# Patient Record
Sex: Female | Born: 1965 | Race: White | Hispanic: No | Marital: Married | Smoking: Never smoker
Health system: Southern US, Community
[De-identification: ages and names within clinical notes are randomized; demographics above are authoritative.]

## PROBLEM LIST (undated history)

## (undated) DIAGNOSIS — N200 Calculus of kidney: Secondary | ICD-10-CM

## (undated) DIAGNOSIS — T7840XA Allergy, unspecified, initial encounter: Secondary | ICD-10-CM

## (undated) DIAGNOSIS — I10 Essential (primary) hypertension: Secondary | ICD-10-CM

## (undated) DIAGNOSIS — E785 Hyperlipidemia, unspecified: Secondary | ICD-10-CM

## (undated) HISTORY — DX: Allergy, unspecified, initial encounter: T78.40XA

## (undated) HISTORY — DX: Calculus of kidney: N20.0

## (undated) HISTORY — DX: Essential (primary) hypertension: I10

---

## 1997-10-21 ENCOUNTER — Other Ambulatory Visit: Admission: RE | Admit: 1997-10-21 | Discharge: 1997-10-21 | Payer: Self-pay | Admitting: Obstetrics and Gynecology

## 1998-10-25 ENCOUNTER — Other Ambulatory Visit: Admission: RE | Admit: 1998-10-25 | Discharge: 1998-10-25 | Payer: Self-pay | Admitting: Obstetrics and Gynecology

## 1999-11-16 ENCOUNTER — Other Ambulatory Visit: Admission: RE | Admit: 1999-11-16 | Discharge: 1999-11-16 | Payer: Self-pay | Admitting: Obstetrics and Gynecology

## 2001-01-05 ENCOUNTER — Other Ambulatory Visit: Admission: RE | Admit: 2001-01-05 | Discharge: 2001-01-05 | Payer: Self-pay | Admitting: Obstetrics and Gynecology

## 2001-04-09 ENCOUNTER — Other Ambulatory Visit: Admission: RE | Admit: 2001-04-09 | Discharge: 2001-04-09 | Payer: Self-pay | Admitting: Obstetrics and Gynecology

## 2002-02-01 ENCOUNTER — Other Ambulatory Visit: Admission: RE | Admit: 2002-02-01 | Discharge: 2002-02-01 | Payer: Self-pay | Admitting: Obstetrics and Gynecology

## 2005-06-11 ENCOUNTER — Emergency Department (HOSPITAL_COMMUNITY): Admission: EM | Admit: 2005-06-11 | Discharge: 2005-06-11 | Payer: Self-pay | Admitting: Family Medicine

## 2009-05-10 ENCOUNTER — Ambulatory Visit: Payer: Self-pay | Admitting: Otolaryngology

## 2013-09-26 DIAGNOSIS — N200 Calculus of kidney: Secondary | ICD-10-CM

## 2013-09-26 HISTORY — DX: Calculus of kidney: N20.0

## 2014-05-03 ENCOUNTER — Encounter: Payer: Self-pay | Admitting: Family Medicine

## 2014-05-18 ENCOUNTER — Ambulatory Visit (INDEPENDENT_AMBULATORY_CARE_PROVIDER_SITE_OTHER): Payer: BC Managed Care – PPO | Admitting: Physician Assistant

## 2014-05-18 ENCOUNTER — Encounter: Payer: Self-pay | Admitting: Physician Assistant

## 2014-05-18 VITALS — BP 136/78 | HR 80 | Temp 98.2°F | Resp 18 | Ht 64.0 in | Wt 134.0 lb

## 2014-05-18 DIAGNOSIS — Z0000 Encounter for general adult medical examination without abnormal findings: Secondary | ICD-10-CM

## 2014-05-18 DIAGNOSIS — Z23 Encounter for immunization: Secondary | ICD-10-CM

## 2014-05-18 LAB — TSH: TSH: 1.222 u[IU]/mL (ref 0.350–4.500)

## 2014-05-18 LAB — CBC WITH DIFFERENTIAL/PLATELET
Basophils Absolute: 0.1 10*3/uL (ref 0.0–0.1)
Basophils Relative: 1 % (ref 0–1)
Eosinophils Absolute: 0.1 10*3/uL (ref 0.0–0.7)
Eosinophils Relative: 1 % (ref 0–5)
HCT: 43.5 % (ref 36.0–46.0)
HEMOGLOBIN: 14.8 g/dL (ref 12.0–15.0)
LYMPHS ABS: 1.5 10*3/uL (ref 0.7–4.0)
LYMPHS PCT: 22 % (ref 12–46)
MCH: 31.7 pg (ref 26.0–34.0)
MCHC: 34 g/dL (ref 30.0–36.0)
MCV: 93.1 fL (ref 78.0–100.0)
MONO ABS: 0.5 10*3/uL (ref 0.1–1.0)
MONOS PCT: 7 % (ref 3–12)
NEUTROS ABS: 4.8 10*3/uL (ref 1.7–7.7)
NEUTROS PCT: 69 % (ref 43–77)
Platelets: 327 10*3/uL (ref 150–400)
RBC: 4.67 MIL/uL (ref 3.87–5.11)
RDW: 13.5 % (ref 11.5–15.5)
WBC: 6.9 10*3/uL (ref 4.0–10.5)

## 2014-05-18 LAB — COMPLETE METABOLIC PANEL WITH GFR
ALT: 21 U/L (ref 0–35)
AST: 19 U/L (ref 0–37)
Albumin: 4.1 g/dL (ref 3.5–5.2)
Alkaline Phosphatase: 45 U/L (ref 39–117)
BUN: 11 mg/dL (ref 6–23)
CALCIUM: 9.3 mg/dL (ref 8.4–10.5)
CHLORIDE: 104 meq/L (ref 96–112)
CO2: 26 meq/L (ref 19–32)
Creat: 0.71 mg/dL (ref 0.50–1.10)
GFR, Est African American: >89 mL/min
GLUCOSE: 86 mg/dL (ref 70–99)
POTASSIUM: 4.4 meq/L (ref 3.5–5.3)
SODIUM: 138 meq/L (ref 135–145)
TOTAL PROTEIN: 6.6 g/dL (ref 6.0–8.3)
Total Bilirubin: 0.4 mg/dL (ref 0.2–1.2)

## 2014-05-18 LAB — LIPID PANEL
CHOL/HDL RATIO: 4.2 ratio
CHOLESTEROL: 258 mg/dL — AB (ref 0–200)
HDL: 62 mg/dL (ref >39)
LDL Cholesterol: 169 mg/dL — ABNORMAL HIGH (ref 0–99)
Triglycerides: 134 mg/dL (ref <150)
VLDL: 27 mg/dL (ref 0–40)

## 2014-05-18 NOTE — Progress Notes (Signed)
Patient ID: MAYLENE CROCKER MRN: [id number], DOB: 1965/11/08, 48 y.o. Date of Encounter: 05/18/2014,   Chief Complaint: Physical (CPE)  HPI: 48 y.o. y/o female  here for CPE.   She is being seen as a new patient to our office to establish care here. She says that her daughter sees me and her son and husband see Dr. Dennard Schaumann here. She felt that she needed to establish a PCP.  She does have a gynecologist who she sees. Last appointment  there was February 2014. Says that they do mammograms there in their office.  The only other medical providers she has seen is a urologist. Says that she saw them for kidney stone. Today she did bring in a copy of lab work done at the urologist. This included CMET., phosphorus level, and uric acid. All of these levels were normal. Patient states that "they told her to just drink more water."  Patient states that one thing that prompted her to initially schedule to get established here was that back around August she was feeling increased stress at work. Says that they did make some changes to her work. 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 Says that she just felt like in the past she would've been able to handle this a lot better than she was able to handle this this year. However, she says she also realizes she really had not had any change for 20 years until now.  She does not know whether it has to do with her age and Peri- Menopause. Says that when she first called to make this appointment back around August she was feeling a lot more stressed than she is now. Says that now that they have been back in school for a couple months she is actually feeling okay with things and really does not think that she needs any treatment for this.  No other complaints or concerns today.    Review of Systems: Consitutional: No fever, chills, fatigue, night sweats, lymphadenopathy. No  significant/unexplained weight changes. Eyes: No visual changes, eye redness, or discharge. ENT/Mouth: No ear pain, sore throat, nasal drainage, or sinus pain. Cardiovascular: No chest pressure,heaviness, tightness or squeezing, even with exertion. No increased shortness of breath or dyspnea on exertion.No palpitations, edema, orthopnea, PND. Respiratory: No cough, hemoptysis, SOB, or wheezing. Gastrointestinal: No anorexia, dysphagia, reflux, pain, nausea, vomiting, hematemesis, diarrhea, constipation, BRBPR, or melena. Breast: No mass, nodules, bulging, or retraction. No skin changes or inflammation. No nipple discharge. No lymphadenopathy. Genitourinary: No dysuria, hematuria, incontinence, vaginal discharge, pruritis, burning, abnormal bleeding, or pain. Musculoskeletal: No decreased ROM, No joint pain or swelling. No significant pain in neck, back, or extremities. Skin: No rash, pruritis, or concerning lesions. Neurological: No headache, dizziness, syncope, seizures, tremors, memory loss, coordination problems, or paresthesias. Psychological: No hallucinations, SI/HI. See HPI.o/w negative. Endocrine: No polydipsia, polyphagia, polyuria, or known diabetes.No increased fatigue. No palpitations/rapid heart rate. No significant/unexplained weight change. All other systems were reviewed and are otherwise negative.  Past Medical History  Diagnosis Date  . Allergy     seasonal  . Hypertension     pt states occasionally  . Kidney stones 09/2013     Past Surgical History  Procedure Laterality Date  . Cesarean section      Home Meds:  Outpatient Prescriptions Prior to Visit  Medication Sig Dispense Refill  . drospirenone-ethinyl estradiol (YAZ,GIANVI,LORYNA) 3-0.02 MG tablet Take  1 tablet by mouth daily.       No facility-administered medications prior to visit.    Allergies: No Known Allergies  History   Social History  . Marital Status: Married    Spouse Name: N/A    Number  of Children: N/A  . Years of Education: N/A   Occupational History  . Not on file.   Social History Main Topics  . Smoking status: Never Smoker   . Smokeless tobacco: Never Used  . Alcohol Use: No  . Drug Use: No  . Sexual Activity: Yes    Birth Control/ Protection: Pill   Other Topics Concern  . Not on file   Social History Narrative   Entered 2015:   Married. 50 son--17 y/o. 50 daughter--24 y/o.   Works as Location manager   does not exercise on a routine basis.  Family History  Problem Relation Age of Onset  . Hyperlipidemia Mother     Physical Exam: Blood pressure 136/78, pulse 80, temperature 98.2 F (36.8 C), temperature source Oral, resp. rate 18, height 5\' 4"  (1.626 m), weight 134 lb (60.782 kg), last menstrual period 05/09/2014., Body mass index is 22.99 kg/(m^2). General: Well developed, well nourished,White Female. Appears in no acute distress. HEENT: Normocephalic, atraumatic. Conjunctiva pink, sclera non-icteric. Pupils 2 mm constricting to 1 mm, round, regular, and equally reactive to light and accomodation. EOMI. Internal auditory canal clear. TMs with good cone of light and without pathology. Nasal mucosa pink. Nares are without discharge. No sinus tenderness. Oral mucosa pink.  Pharynx without exudate.   Neck: Supple. Trachea midline. No thyromegaly. Full ROM. No lymphadenopathy.No Carotid Bruits. Lungs: Clear to auscultation bilaterally without wheezes, rales, or rhonchi. Breathing is of normal effort and unlabored. Cardiovascular: RRR with S1 S2. No murmurs, rubs, or gallops. Distal pulses 2+ symmetrically. No carotid or abdominal bruits. Breast: Per Gyn.  Abdomen: Soft, non-tender, non-distended with normoactive bowel sounds. No hepatosplenomegaly or masses. No rebound/guarding. No CVA tenderness. No hernias.  Genitourinary:  Per Gyn. Musculoskeletal: Full range of motion and 5/5 strength throughout. Without swelling, atrophy, tenderness,  crepitus, or warmth. Extremities without clubbing, cyanosis, or edema.  Skin: Warm and moist without erythema, ecchymosis, wounds, or rash. Neuro: A+Ox3. CN II-XII grossly intact. Moves all extremities spontaneously. Full sensation throughout. Normal gait. DTR 2+ throughout upper and lower extremities. Finger to nose intact. Psych:  Responds to questions appropriately with a normal affect.   Assessment/Plan:  48 y.o. y/o female here for CPE 1. Visit for preventive health examination  A. Screening Labs: - CBC with Differential - COMPLETE METABOLIC PANEL WITH GFR - Lipid panel - TSH   B. Pap:   -------Per Gyn  C. Screening Mammogram: --------Per Gyn  D. DEXA/BMD:  -------per Gyn  E. Colorectal Cancer Screening: She reports that she had one colonoscopy but says it was greater than 10 years ago. She is agreeable for [place] to do referral to GI to do screening colonoscopy. - Ambulatory referral to Gastroenterology   F. Immunizations:  Influenza:  She is agreeable to receive this today. Tetanus:   She states that she was given a tetanus at the school system in the past 5 years. I told her to try to get a record of this and fax it or bring it to our office for [place] to have on record. Pneumococcal:  She has no indication to need pneumonia vaccine until age 20 Zostavax: Not indicated until age 48  Need for prophylactic vaccination and inoculation  against influenza - Flu Vaccine QUAD 36+ mos PF IM (Fluarix Quad PF)    Signed, [address] [place], [place], [facility] 05/18/2014 11:07 AM

## 2016-09-09 ENCOUNTER — Ambulatory Visit (INDEPENDENT_AMBULATORY_CARE_PROVIDER_SITE_OTHER): Payer: BC Managed Care – PPO | Admitting: Physician Assistant

## 2016-09-09 VITALS — BP 122/82 | HR 77 | Temp 97.4°F | Resp 14 | Wt 138.0 lb

## 2016-09-09 DIAGNOSIS — Z0000 Encounter for general adult medical examination without abnormal findings: Secondary | ICD-10-CM

## 2016-09-09 DIAGNOSIS — R1030 Lower abdominal pain, unspecified: Secondary | ICD-10-CM | POA: Diagnosis not present

## 2016-09-09 DIAGNOSIS — M545 Low back pain, unspecified: Secondary | ICD-10-CM

## 2016-09-09 DIAGNOSIS — Z23 Encounter for immunization: Secondary | ICD-10-CM

## 2016-09-09 LAB — COMPLETE METABOLIC PANEL WITH GFR
ALBUMIN: 4 g/dL (ref 3.6–5.1)
ALK PHOS: 42 U/L (ref 33–130)
ALT: 12 U/L (ref 6–29)
AST: 12 U/L (ref 10–35)
BUN: 15 mg/dL (ref 7–25)
CO2: 24 mmol/L (ref 20–31)
Calcium: 9.3 mg/dL (ref 8.6–10.4)
Chloride: 104 mmol/L (ref 98–110)
Creat: 0.78 mg/dL (ref 0.50–1.05)
GFR, EST NON AFRICAN AMERICAN: 88 mL/min (ref >=60)
GLUCOSE: 90 mg/dL (ref 70–99)
POTASSIUM: 4.2 mmol/L (ref 3.5–5.3)
SODIUM: 137 mmol/L (ref 135–146)
Total Bilirubin: 0.4 mg/dL (ref 0.2–1.2)
Total Protein: 6.3 g/dL (ref 6.1–8.1)

## 2016-09-09 LAB — URINALYSIS, ROUTINE W REFLEX MICROSCOPIC
Bilirubin Urine: NEGATIVE
GLUCOSE, UA: NEGATIVE
HGB URINE DIPSTICK: NEGATIVE
Ketones, ur: NEGATIVE
NITRITE: NEGATIVE
PH: 6.5 (ref 5.0–8.0)
Protein, ur: NEGATIVE
SPECIFIC GRAVITY, URINE: 1.01 (ref 1.001–1.035)

## 2016-09-09 LAB — URINALYSIS, MICROSCOPIC ONLY
CASTS: NONE SEEN [LPF]
CRYSTALS: NONE SEEN [HPF]
Yeast: NONE SEEN [HPF]

## 2016-09-09 LAB — CBC WITH DIFFERENTIAL/PLATELET
BASOS ABS: 55 {cells}/uL (ref 0–200)
Basophils Relative: 1 %
EOS PCT: 1 %
Eosinophils Absolute: 55 cells/uL (ref 15–500)
HCT: 42.3 % (ref 35.0–45.0)
Hemoglobin: 14 g/dL (ref 12.0–15.0)
Lymphocytes Relative: 23 %
Lymphs Abs: 1265 cells/uL (ref 850–3900)
MCH: 31.2 pg (ref 27.0–33.0)
MCHC: 33.1 g/dL (ref 32.0–36.0)
MCV: 94.2 fL (ref 80.0–100.0)
MONOS PCT: 6 %
MPV: 9 fL (ref 7.5–12.5)
Monocytes Absolute: 330 cells/uL (ref 200–950)
NEUTROS PCT: 69 %
Neutro Abs: 3795 cells/uL (ref 1500–7800)
PLATELETS: 272 10*3/uL (ref 140–400)
RBC: 4.49 MIL/uL (ref 3.80–5.10)
RDW: 13.7 % (ref 11.0–15.0)
WBC: 5.5 10*3/uL (ref 3.8–10.8)

## 2016-09-09 LAB — LIPID PANEL
CHOL/HDL RATIO: 4.7 ratio (ref <5.0)
Cholesterol: 261 mg/dL — ABNORMAL HIGH (ref <200)
HDL: 55 mg/dL (ref >50)
LDL Cholesterol: 158 mg/dL — ABNORMAL HIGH (ref <100)
TRIGLYCERIDES: 240 mg/dL — AB (ref <150)
VLDL: 48 mg/dL — ABNORMAL HIGH (ref <30)

## 2016-09-09 LAB — TSH: TSH: 1.65 m[IU]/L

## 2016-09-09 MED ORDER — DROSPIRENONE-ETHINYL ESTRADIOL 3-0.02 MG PO TABS: 1.0000 | ORAL_TABLET | Freq: Every day | ORAL | 11 refills | Status: DC

## 2016-09-09 NOTE — Progress Notes (Signed)
Patient ID: Tabitha Warren MRN: [id number], DOB: [birth year], 51 y.o. Date of Encounter: 09/09/2016,   Chief Complaint: Physical (CPE)  HPI: 51 y.o. y/o female      05/18/2014: Here for CPE.   She is being seen as a new patient to our office to establish care here. She says that her daughter sees me and her son and husband see Dr. Dennard Warren here. She felt that she needed to establish a PCP.  She does have a gynecologist who she sees. Last appointment  there was February 2014. Says that they do mammograms there in their office.  The only other medical providers she has seen is a urologist. Says that she saw them for kidney stone. Today she did bring in a copy of lab work done at the urologist. This included CMET., phosphorus level, and uric acid. All of these levels were normal. Patient states that "they told her to just drink more water."  Patient states that one thing that prompted her to initially schedule to get established here was that back around August she was feeling increased stress at work. Says that they did make some changes to her work. 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 Says that she just felt like in the past she would've been able to handle this a lot better than she was able to handle this this year. However, she says she also realizes she really had not had any change for 20 years until now.  She does not know whether it has to do with her age and Peri- Menopause. Says that when she first called to make this appointment back around August she was feeling a lot more stressed than she is now. Says that now that they have been back in school for a couple months she is actually feeling okay with things and really does not think that she needs any treatment for this.  No other complaints or concerns today.    09/09/2016: She reports that after her last physical she checked her records and  her prior colonoscopy was performed March 2007. This was performed up in [place]. Says that back then she was living and working up in that area but no longer is-- so would like to start getting these done in [place]. Also reports that she had been seeing GYN in [place] and would like for me to do her pelvic exam and Pap smear here. Was having her mammogram performed by the GYN in [place] but would like to start having this performed in [place] now.  Reports that for the past 1-2 weeks she has been feeling a little bit of burning and pressure in her low mid abdomen. Also some achiness in her right low back. Saturday (09/07/2016) the pain in her right low back would increase if she stepped a certain way-- but since then it is just been more of an achiness. "Thought it was a pulled muscle but not sure". No dysuria, no urinary urgency or frequency.  Reports that she has retired. Now is keeping her 53-month-old Tabitha Warren.  At end of visit asks if I will send refill on her BCP. Says it was started by her Gyn b/c of heavy menses. Discussed / Recommended stopping this given her age and risk of hormones. She requests that I refill for now and she "will think about it" Discussed risks associated with hormone therapy.  Review of  Systems: Consitutional: No fever, chills, fatigue, night sweats, lymphadenopathy. No significant/unexplained weight changes. Eyes: No visual changes, eye redness, or discharge. ENT/Mouth: No ear pain, sore throat, nasal drainage, or sinus pain. Cardiovascular: No chest pressure,heaviness, tightness or squeezing, even with exertion. No increased shortness of breath or dyspnea on exertion.No palpitations, edema, orthopnea, PND. Respiratory: No cough, hemoptysis, SOB, or wheezing. Gastrointestinal: No anorexia, dysphagia, reflux, nausea, vomiting, hematemesis, diarrhea, constipation, BRBPR, or melena. Breast: No mass, nodules, bulging, or retraction. No skin changes or  inflammation. No nipple discharge. No lymphadenopathy. Genitourinary: No dysuria, hematuria, incontinence, vaginal discharge, pruritis, burning, abnormal bleeding, or pain. Musculoskeletal: No decreased ROM, No joint pain or swelling. No significant pain in neck, back, or extremities. Skin: No rash, pruritis, or concerning lesions. Neurological: No headache, dizziness, syncope, seizures, tremors, memory loss, coordination problems, or paresthesias. Psychological: No hallucinations, SI/HI. See HPI.o/w negative. Endocrine: No polydipsia, polyphagia, polyuria, or known diabetes.No increased fatigue. No palpitations/rapid heart rate. No significant/unexplained weight change. All other systems were reviewed and are otherwise negative.  Past Medical History:  Diagnosis Date  . Allergy    seasonal  . Hypertension    pt states occasionally  . Kidney stones 09/2013     Past Surgical History:  Procedure Laterality Date  . CESAREAN SECTION      Home Meds:  Outpatient Medications Prior to Visit  Medication Sig Dispense Refill  . drospirenone-ethinyl estradiol (YAZ,GIANVI,LORYNA) 3-0.02 MG tablet Take 1 tablet by mouth daily.     No facility-administered medications prior to visit.     Allergies: No Known Allergies  Social History   Social History  . Marital status: Married    Spouse name: N/A  . Number of children: N/A  . Years of education: N/A   Occupational History  . Not on file.   Social History Main Topics  . Smoking status: Never Smoker  . Smokeless tobacco: Never Used  . Alcohol use No  . Drug use: No  . Sexual activity: Yes    Birth control/ protection: Pill   Other Topics Concern  . Not on file   Social History Narrative   Entered 2015:   Married. 53 son--17 y/o. 47 daughter--24 y/o.   Works as Location manager   does not exercise on a routine basis. 08/2016---Retired. Now keeps her 15 month old Tabitha Warren  Family History  Problem Relation  Age of Onset  . Hyperlipidemia Mother     Physical Exam: Blood pressure 122/82, pulse 77, temperature 97.4 F (36.3 C), temperature source Oral, resp. rate 14, weight 138 lb (62.6 kg), SpO2 98 %., Body mass index is 23.69 kg/m. General: Well developed, well nourished,White Female. Appears in no acute distress. HEENT: Normocephalic, atraumatic. Conjunctiva pink, sclera non-icteric. Pupils 2 mm constricting to 1 mm, round, regular, and equally reactive to light and accomodation. EOMI. Internal auditory canal clear. TMs with good cone of light and without pathology. Nasal mucosa pink. Nares are without discharge. No sinus tenderness. Oral mucosa pink.  Pharynx without exudate.   Neck: Supple. Trachea midline. No thyromegaly. Full ROM. No lymphadenopathy.No Carotid Bruits. Lungs: Clear to auscultation bilaterally without wheezes, rales, or rhonchi. Breathing is of normal effort and unlabored. Cardiovascular: RRR with S1 S2. No murmurs, rubs, or gallops. Distal pulses 2+ symmetrically. No carotid or abdominal bruits. Breast: Inspection is normal with no skin changes. Palpation is normal with no mass. No nipple discharge. Abdomen: Soft, non-tender, non-distended with normoactive bowel sounds. No hepatosplenomegaly or masses. No rebound/guarding. No CVA  tenderness. No hernias.  Genitourinary:  External genitalia normal. Vaginal mucosa normal. Cervix normal. Bimanual exam is normal with no adnexal mass or tenderness. Musculoskeletal: Full range of motion and 5/5 strength throughout.  Skin: Warm and moist without erythema, ecchymosis, wounds, or rash. Neuro: A+Ox3. CN II-XII grossly intact. Moves all extremities spontaneously. Full sensation throughout. Normal gait. DTR 2+ throughout upper and lower extremities.  Psych:  Responds to questions appropriately with a normal affect.   Assessment/Plan:  51 y.o. y/o female here for CPE 1. Visit for preventive health examination  A. Screening Labs: - CBC  with Differential - COMPLETE METABOLIC PANEL WITH GFR - Lipid panel - TSH - Vitamin D  B. Pap:   - PAP, Thin Prep w/HPV rflx HPV Type 16/18   C. Screening Mammogram: - MM Digital Screening; Future  D. DEXA/BMD:  Only age 55. Can wait to start doing this.  E. Colorectal Cancer Screening: See HPI from 09/09/2016 OV notefor details - Ambulatory referral to Gastroenterology  F. Immunizations:  Influenza:  She is agreeable to receive this today. Tetanus:   She states that she was given a tetanus at the school system in the past 5 years. I told her to try to get a record of this and fax it or bring it to our office for [place] to have on record. Pneumococcal:  She has no indication to need pneumonia vaccine until age 36 Zostavax: Not indicated until age 42  Need for prophylactic vaccination and inoculation against influenza - Flu Vaccine QUAD 36+ mos PF IM (Fluarix Quad PF)   2. Lower abdominal pain - Urinalysis, Routine w reflex microscopic - Urine culture  3. Acute right-sided low back pain without sciatica - Urinalysis, Routine w reflex microscopic - Urine culture  At end of visit asks if I will send refill on her BCP. Says it was started by her Gyn b/c of heavy menses. Discussed / Recommended stopping this given her age and risk of hormones. She requests that I refill for now and she "will think about it" Discussed risks associated with hormone therapy.   Follow Up office visit one year or sooner if needed.    Marin Olp Vincent, [place], [facility] 09/09/2016 8:56 AM

## 2016-09-09 NOTE — Addendum Note (Signed)
Addended by: Vonna Kotyk A on: 09/09/2016 09:41 AM   Modules accepted: Orders

## 2016-09-10 LAB — VITAMIN D 25 HYDROXY (VIT D DEFICIENCY, FRACTURES): Vit D, 25-Hydroxy: 18 ng/mL — ABNORMAL LOW (ref 30–100)

## 2016-09-10 LAB — URINE CULTURE: Organism ID, Bacteria: NO GROWTH

## 2016-09-11 ENCOUNTER — Other Ambulatory Visit: Payer: Self-pay

## 2016-09-11 DIAGNOSIS — E559 Vitamin D deficiency, unspecified: Secondary | ICD-10-CM

## 2016-09-11 MED ORDER — CHOLECALCIFEROL 100 MCG (4000 UT) PO CAPS: 1.0000 | ORAL_CAPSULE | Freq: Every day | ORAL | 0 refills | Status: AC

## 2016-09-12 LAB — PAP, THIN PREP W/HPV RFLX HPV TYPE 16/18: HPV DNA High Risk: NOT DETECTED

## 2016-10-17 ENCOUNTER — Ambulatory Visit
Admission: RE | Admit: 2016-10-17 | Discharge: 2016-10-17 | Disposition: A | Discharge status: Home / Self Care | Payer: BC Managed Care – PPO | Source: Ambulatory Visit | Attending: Physician Assistant | Admitting: Physician Assistant

## 2016-10-17 DIAGNOSIS — Z0000 Encounter for general adult medical examination without abnormal findings: Secondary | ICD-10-CM

## 2016-10-17 DIAGNOSIS — Z1231 Encounter for screening mammogram for malignant neoplasm of breast: Secondary | ICD-10-CM | POA: Diagnosis not present

## 2016-10-22 ENCOUNTER — Inpatient Hospital Stay
Admission: RE | Admit: 2016-10-22 | Discharge: 2016-10-22 | Disposition: A | Discharge status: Home / Self Care | Payer: Self-pay | Source: Ambulatory Visit | Attending: *Deleted | Admitting: *Deleted

## 2016-10-22 ENCOUNTER — Other Ambulatory Visit: Payer: Self-pay | Admitting: *Deleted

## 2016-10-22 DIAGNOSIS — Z9289 Personal history of other medical treatment: Secondary | ICD-10-CM

## 2016-10-22 LAB — HM MAMMOGRAPHY

## 2016-10-30 ENCOUNTER — Telehealth: Payer: Self-pay | Admitting: Gastroenterology

## 2016-10-30 ENCOUNTER — Other Ambulatory Visit: Payer: Self-pay

## 2016-10-30 ENCOUNTER — Telehealth: Payer: Self-pay

## 2016-10-30 DIAGNOSIS — Z1212 Encounter for screening for malignant neoplasm of rectum: Principal | ICD-10-CM

## 2016-10-30 DIAGNOSIS — Z1211 Encounter for screening for malignant neoplasm of colon: Secondary | ICD-10-CM

## 2016-10-30 NOTE — Telephone Encounter (Signed)
Patient is returning a call regarding a colonoscopy

## 2016-10-30 NOTE — Telephone Encounter (Signed)
Gastroenterology Pre-Procedure Review  Request Date: 11/19/16 Requesting Physician: Dr. Vicente Males  PATIENT REVIEW QUESTIONS: The patient responded to the following health history questions as indicated:    1. Are you having any GI issues? no 2. Do you have a personal history of Polyps? no 3. Do you have a family history of Colon Cancer or Polyps? no 4. Diabetes Mellitus? no 5. Joint replacements in the past 12 months?no 6. Major health problems in the past 3 months?no 7. Any artificial heart valves, MVP, or defibrillator?no    MEDICATIONS & ALLERGIES:    Patient reports the following regarding taking any anticoagulation/antiplatelet therapy:   Plavix, Coumadin, Eliquis, Xarelto, Lovenox, Pradaxa, Brilinta, or Effient? no Aspirin? no  Patient confirms/reports the following medications:  Current Outpatient Prescriptions  Medication Sig Dispense Refill  . Cholecalciferol 4000 units CAPS Take 1 capsule (4,000 Units total) by mouth daily. 30 capsule 0  . drospirenone-ethinyl estradiol (YAZ,GIANVI,LORYNA) 3-0.02 MG tablet Take 1 tablet by mouth daily. 1 Package 11   No current facility-administered medications for this visit.     Patient confirms/reports the following allergies:  No Known Allergies  No orders of the defined types were placed in this encounter.   AUTHORIZATION INFORMATION Primary Insurance: 1D#: Group #:  Secondary Insurance: 1D#: Group #:  SCHEDULE INFORMATION: Date: 11/19/16 Time: Location: [place]

## 2016-10-30 NOTE — Telephone Encounter (Signed)
10/30/16 Spoke with Jodi Geralds w/ [employer] and prior auth is NOT required for Screening Colonoscopy [phone] / Z12.11

## 2016-11-14 ENCOUNTER — Telehealth: Payer: Self-pay | Admitting: Gastroenterology

## 2016-11-14 NOTE — Telephone Encounter (Signed)
Patient needs to reschedule her colonoscopy

## 2016-11-28 ENCOUNTER — Encounter: Payer: Self-pay | Admitting: *Deleted

## 2016-11-29 ENCOUNTER — Encounter
Admission: RE | Disposition: A | Discharge status: Home / Self Care | Payer: Self-pay | Source: Ambulatory Visit | Attending: Gastroenterology

## 2016-11-29 ENCOUNTER — Ambulatory Visit
Admission: RE | Admit: 2016-11-29 | Discharge: 2016-11-29 | Disposition: A | Discharge status: Home / Self Care | Payer: BC Managed Care – PPO | Source: Ambulatory Visit | Attending: Gastroenterology | Admitting: Gastroenterology

## 2016-11-29 ENCOUNTER — Ambulatory Visit: Payer: BC Managed Care – PPO | Admitting: Registered Nurse

## 2016-11-29 ENCOUNTER — Encounter: Payer: Self-pay | Admitting: Anesthesiology

## 2016-11-29 DIAGNOSIS — Z1212 Encounter for screening for malignant neoplasm of rectum: Secondary | ICD-10-CM | POA: Diagnosis not present

## 2016-11-29 DIAGNOSIS — Z1211 Encounter for screening for malignant neoplasm of colon: Secondary | ICD-10-CM | POA: Insufficient documentation

## 2016-11-29 DIAGNOSIS — I10 Essential (primary) hypertension: Secondary | ICD-10-CM | POA: Diagnosis not present

## 2016-11-29 DIAGNOSIS — Z793 Long term (current) use of hormonal contraceptives: Secondary | ICD-10-CM | POA: Diagnosis not present

## 2016-11-29 DIAGNOSIS — D120 Benign neoplasm of cecum: Secondary | ICD-10-CM | POA: Diagnosis not present

## 2016-11-29 HISTORY — PX: COLONOSCOPY WITH PROPOFOL: SHX5780

## 2016-11-29 LAB — POCT PREGNANCY, URINE: Preg Test, Ur: NEGATIVE

## 2016-11-29 SURGERY — COLONOSCOPY WITH PROPOFOL
Anesthesia: General

## 2016-11-29 MED ORDER — PROPOFOL 10 MG/ML IV BOLUS
INTRAVENOUS | Status: AC
  Filled 2016-11-29: qty 40

## 2016-11-29 MED ORDER — METHYLENE BLUE 0.5 % INJ SOLN
INTRAVENOUS | Status: DC | PRN
  Administered 2016-11-29: 7 mL via SUBMUCOSAL

## 2016-11-29 MED ORDER — SODIUM CHLORIDE 0.9 % IV SOLN
INTRAVENOUS | Status: DC
  Administered 2016-11-29: 1000 mL via INTRAVENOUS

## 2016-11-29 MED ORDER — FENTANYL CITRATE (PF) 100 MCG/2ML IJ SOLN
INTRAMUSCULAR | Status: AC
  Filled 2016-11-29: qty 2

## 2016-11-29 MED ORDER — PROPOFOL 500 MG/50ML IV EMUL
INTRAVENOUS | Status: DC | PRN
  Administered 2016-11-29: 150 ug/kg/min via INTRAVENOUS

## 2016-11-29 MED ORDER — MIDAZOLAM HCL 2 MG/2ML IJ SOLN
INTRAMUSCULAR | Status: AC
  Filled 2016-11-29: qty 2

## 2016-11-29 MED ORDER — SODIUM CHLORIDE 0.9 % IJ SOLN
INTRAMUSCULAR | Status: DC | PRN
  Administered 2016-11-29: 2 mL

## 2016-11-29 MED ORDER — METHYLENE BLUE 0.5 % INJ SOLN
INTRAVENOUS | Status: AC
  Filled 2016-11-29: qty 10

## 2016-11-29 MED ORDER — LIDOCAINE HCL (CARDIAC) 20 MG/ML IV SOLN
INTRAVENOUS | Status: DC | PRN
  Administered 2016-11-29: 40 mg via INTRAVENOUS

## 2016-11-29 MED ORDER — PROPOFOL 10 MG/ML IV BOLUS
INTRAVENOUS | Status: DC | PRN
  Administered 2016-11-29: 70 mg via INTRAVENOUS
  Administered 2016-11-29 (×2): 10 mg via INTRAVENOUS

## 2016-11-29 MED ORDER — MIDAZOLAM HCL 2 MG/2ML IJ SOLN
INTRAMUSCULAR | Status: DC | PRN
  Administered 2016-11-29: 2 mg via INTRAVENOUS

## 2016-11-29 MED ORDER — FENTANYL CITRATE (PF) 100 MCG/2ML IJ SOLN
INTRAMUSCULAR | Status: DC | PRN
  Administered 2016-11-29 (×2): 25 ug via INTRAVENOUS
  Administered 2016-11-29: 50 ug via INTRAVENOUS

## 2016-11-29 NOTE — Anesthesia Post-op Follow-up Note (Cosign Needed)
Anesthesia QCDR form completed.        

## 2016-11-29 NOTE — Anesthesia Preprocedure Evaluation (Signed)
Anesthesia Evaluation  Patient identified by MRN, date of birth, ID band Patient awake    Reviewed: Allergy & Precautions, NPO status , Patient's Chart, lab work & pertinent test results  Airway Mallampati: II       Dental no notable dental hx.    Pulmonary neg pulmonary ROS,    Pulmonary exam normal        Cardiovascular hypertension, Normal cardiovascular exam     Neuro/Psych negative neurological ROS  negative psych ROS   GI/Hepatic negative GI ROS, Neg liver ROS,   Endo/Other  negative endocrine ROS  Renal/GU stones  negative genitourinary   Musculoskeletal negative musculoskeletal ROS (+)   Abdominal Normal abdominal exam  (+)   Peds negative pediatric ROS (+)  Hematology negative hematology ROS (+)   Anesthesia Other Findings   Reproductive/Obstetrics negative OB ROS                             Anesthesia Physical Anesthesia Plan  ASA: II  Anesthesia Plan: General   Post-op Pain Management:    Induction: Intravenous  Airway Management Planned: Nasal Cannula  Additional Equipment:   Intra-op Plan:   Post-operative Plan:   Informed Consent: I have reviewed the patients History and Physical, chart, labs and discussed the procedure including the risks, benefits and alternatives for the proposed anesthesia with the patient or authorized representative who has indicated his/her understanding and acceptance.   Dental advisory given  Plan Discussed with: CRNA and Surgeon  Anesthesia Plan Comments:         Anesthesia Quick Evaluation

## 2016-11-29 NOTE — H&P (Signed)
  Jonathon Bellows MD [address], [place], [place] [address] Phone: [phone] Fax : [phone]  Primary Care Physician:  Karis Juba, PA-C Primary Gastroenterologist:  Dr. Jonathon Bellows   Pre-Procedure History & Physical: HPI:  Tabitha Warren is a 51 y.o. female is here for an colonoscopy.   Past Medical History:  Diagnosis Date  . Allergy    seasonal  . Hypertension    pt states occasionally  . Kidney stones 09/2013    Past Surgical History:  Procedure Laterality Date  . CESAREAN SECTION      Prior to Admission medications   Medication Sig Start Date End Date Taking? Authorizing Provider  Cholecalciferol 4000 units CAPS Take 1 capsule (4,000 Units total) by mouth daily. 09/11/16  Yes Orlena Sheldon, PA-C  drospirenone-ethinyl estradiol (YAZ,GIANVI,LORYNA) 3-0.02 MG tablet Take 1 tablet by mouth daily. 09/09/16  Yes Orlena Sheldon, PA-C    Allergies as of 10/30/2016  . (No Known Allergies)    Family History  Problem Relation Age of Onset  . Hyperlipidemia Mother     Social History   Social History  . Marital status: Married    Spouse name: N/A  . Number of children: N/A  . Years of education: N/A   Occupational History  . Not on file.   Social History Main Topics  . Smoking status: Never Smoker  . Smokeless tobacco: Never Used  . Alcohol use No  . Drug use: No  . Sexual activity: Yes    Birth control/ protection: Pill   Other Topics Concern  . Not on file   Social History Narrative   Entered 2015:   Married. 50 son--17 y/o. 69 daughter--24 y/o.   Works as Location manager    Review of Systems: See HPI, otherwise negative ROS  Physical Exam: BP (!) 147/82   Pulse 89   Temp 97 F (36.1 C) (Tympanic)   Resp 16   Ht 5\' 3"  (1.6 m)   Wt 140 lb (63.5 kg)   SpO2 100%   BMI 24.80 kg/m  General:   Alert,  pleasant and cooperative in NAD Head:  Normocephalic and atraumatic. Neck:  Supple; no masses or  thyromegaly. Lungs:  Clear throughout to auscultation.    Heart:  Regular rate and rhythm. Abdomen:  Soft, nontender and nondistended. Normal bowel sounds, without guarding, and without rebound.   Neurologic:  Alert and  oriented x4;  grossly normal neurologically.  Impression/Plan: AYISHA POL is here for an colonoscopy to be performed for Screening colonoscopy average risk    Risks, benefits, limitations, and alternatives regarding  colonoscopy have been reviewed with the patient.  Questions have been answered.  All parties agreeable.   Jonathon Bellows, MD  11/29/2016, 9:39 AM

## 2016-11-29 NOTE — Anesthesia Procedure Notes (Signed)
Date/Time: 11/29/2016 9:44 AM Performed by: Doreen Salvage Pre-anesthesia Checklist: Patient identified, Emergency Drugs available, Suction available and Patient being monitored Patient Re-evaluated:Patient Re-evaluated prior to inductionOxygen Delivery Method: Nasal cannula Intubation Type: IV induction Dental Injury: Teeth and Oropharynx as per pre-operative assessment  Comments: Nasal cannula with etCO2 monitoring

## 2016-11-29 NOTE — Anesthesia Postprocedure Evaluation (Signed)
Anesthesia Post Note  Patient: Tabitha Warren  Procedure(s) Performed: Procedure(s) (LRB): COLONOSCOPY WITH PROPOFOL (N/A)  Patient location during evaluation: PACU Anesthesia Type: General Level of consciousness: awake and alert and oriented Pain management: pain level controlled Vital Signs Assessment: post-procedure vital signs reviewed and stable Respiratory status: spontaneous breathing Cardiovascular status: blood pressure returned to baseline Anesthetic complications: no     Last Vitals:  Vitals:   11/29/16 1035 11/29/16 1045  BP: 131/90 (!) 126/92  Pulse: 70 69  Resp: 19 17  Temp:      Last Pain:  Vitals:   11/29/16 1016  TempSrc: Tympanic                 Eladio Dentremont

## 2016-11-29 NOTE — Op Note (Signed)
[facility] Gastroenterology Patient Name: Tabitha Warren Procedure Date: 11/29/2016 9:39 AM MRN: [id number] Account #: [id number] Date of Birth: 01-22-1966 Admit Type: Outpatient Age: 51 Room: [place] ENDO ROOM 4 Gender: Female Note Status: Finalized Procedure:            Colonoscopy Indications:          Screening for colorectal malignant neoplasm Providers:            Jonathon Bellows MD, MD Referring MD:         Lonie Peak. Doren Custard (Referring MD) Medicines:            Monitored Anesthesia Care Complications:        No immediate complications. Procedure:            Pre-Anesthesia Assessment:                       - Prior to the procedure, a History and Physical was                        performed, and patient medications, allergies and                        sensitivities were reviewed. The patient's tolerance of                        previous anesthesia was reviewed.                       - The risks and benefits of the procedure and the                        sedation options and risks were discussed with the                        patient. All questions were answered and informed                        consent was obtained.                       - ASA Grade Assessment: II - A patient with mild                        systemic disease.                       After obtaining informed consent, the colonoscope was                        passed under direct vision. Throughout the procedure,                        the patient's blood pressure, pulse, and oxygen                        saturations were monitored continuously. The                        Colonoscope was introduced through the anus and  advanced to the the cecum, identified by the                        appendiceal orifice, IC valve and transillumination.                        The colonoscopy was performed with ease. The patient                        tolerated the procedure well. The quality of  the bowel                        preparation was good. Findings:      The perianal and digital rectal examinations were normal.      A 20 mm polyp was found in the cecum. The polyp was semi-sessile.       Preparations were made for mucosal resection. Saline with methylene blue       was injected to raise the lesion. Snare mucosal resection was performed.       Resection and retrieval were complete. To prevent bleeding after the       polypectomy, two hemostatic clips were successfully placed. There was no       bleeding during the maneuver.      The exam was otherwise without abnormality on direct and retroflexion       views. Impression:           - One 20 mm polyp in the cecum, removed with mucosal                        resection. Resected and retrieved. Clips were placed.                       - The examination was otherwise normal on direct and                        retroflexion views.                       - Mucosal resection was performed. Resection and                        retrieval were complete. Recommendation:       - Discharge patient to home (with escort).                       - Resume previous diet.                       - Continue present medications.                       - Await pathology results.                       - Repeat colonoscopy for surveillance based on                        pathology results. Procedure Code(s):    --- Professional ---                       [phone], Colonoscopy, flexible; with endoscopic  mucosal                        resection Diagnosis Code(s):    --- Professional ---                       Z12.11, Encounter for screening for malignant neoplasm                        of colon                       D12.0, Benign neoplasm of cecum CPT copyright 2016 American Medical Association. All rights reserved. The codes documented in this report are preliminary and upon coder review may  be revised to meet current compliance requirements. Jonathon Bellows, MD Jonathon Bellows MD, MD 11/29/2016 10:13:01 AM This report has been signed electronically. Number of Addenda: 0 Note Initiated On: 11/29/2016 9:39 AM Scope Withdrawal Time: 0 hours 20 minutes 5 seconds  Total Procedure Duration: 0 hours 23 minutes 0 seconds       [facility]

## 2016-11-29 NOTE — Transfer of Care (Signed)
Immediate Anesthesia Transfer of Care Note  Patient: Tabitha Warren  Procedure(s) Performed: Procedure(s): COLONOSCOPY WITH PROPOFOL (N/A)  Patient Location: PACU and Endoscopy Unit  Anesthesia Type:General  Level of Consciousness: sedated  Airway & Oxygen Therapy: Patient Spontanous Breathing and Patient connected to nasal cannula oxygen  Post-op Assessment: Report given to RN and Post -op Vital signs reviewed and stable  Post vital signs: Reviewed and stable  Last Vitals:  Vitals:   11/29/16 0841 11/29/16 1016  BP: (!) 147/82 113/81  Pulse: 89 85  Resp: 16 20  Temp: 36.1 C 16.5 C    Complications: No apparent anesthesia complications

## 2016-12-02 ENCOUNTER — Encounter: Payer: Self-pay | Admitting: Gastroenterology

## 2016-12-02 LAB — SURGICAL PATHOLOGY

## 2017-08-31 ENCOUNTER — Other Ambulatory Visit: Payer: Self-pay | Admitting: Physician Assistant

## 2017-09-01 NOTE — Telephone Encounter (Signed)
Rx filled per protocol. Letter mailed for patient to call and schedule an appointment.

## 2017-09-15 ENCOUNTER — Encounter: Payer: BC Managed Care – PPO | Admitting: Physician Assistant

## 2017-09-18 ENCOUNTER — Ambulatory Visit (INDEPENDENT_AMBULATORY_CARE_PROVIDER_SITE_OTHER): Payer: BC Managed Care – PPO | Admitting: Physician Assistant

## 2017-09-18 ENCOUNTER — Encounter: Payer: Self-pay | Admitting: Physician Assistant

## 2017-09-18 ENCOUNTER — Other Ambulatory Visit: Payer: Self-pay

## 2017-09-18 ENCOUNTER — Encounter: Payer: BC Managed Care – PPO | Admitting: Physician Assistant

## 2017-09-18 VITALS — BP 132/88 | HR 89 | Temp 98.1°F | Resp 14 | Ht 65.5 in | Wt 141.6 lb

## 2017-09-18 DIAGNOSIS — E559 Vitamin D deficiency, unspecified: Secondary | ICD-10-CM

## 2017-09-18 DIAGNOSIS — Z23 Encounter for immunization: Secondary | ICD-10-CM | POA: Diagnosis not present

## 2017-09-18 DIAGNOSIS — Z0000 Encounter for general adult medical examination without abnormal findings: Secondary | ICD-10-CM | POA: Diagnosis not present

## 2017-09-18 NOTE — Progress Notes (Signed)
Patient ID: Tabitha Warren MRN: [id number], DOB: July 10, 1966, 52 y.o. Date of Encounter: 09/18/2017,   Chief Complaint: Physical (CPE)  HPI: 52 y.o. y/o female      05/18/2014: Here for CPE.   She is being seen as a new patient to our office to establish care here. She says that her daughter sees me and her son and husband see Dr. Dennard Schaumann here. She felt that she needed to establish a PCP.  She does have a gynecologist who she sees. Last appointment  there was February 2014. Says that they do mammograms there in their office.  The only other medical providers she has seen is a urologist. Says that she saw them for kidney stone. Today she did bring in a copy of lab work done at the urologist. This included CMET., phosphorus level, and uric acid. All of these levels were normal. Patient states that "they told her to just drink more water."  Patient states that one thing that prompted her to initially schedule to get established here was that back around August she was feeling increased stress at work. Says that they did make some changes to her work. 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 Says that she just felt like in the past she would've been able to handle this a lot better than she was able to handle this this year. However, she says she also realizes she really had not had any change for 20 years until now.  She does not know whether it has to do with her age and Peri- Menopause. Says that when she first called to make this appointment back around August she was feeling a lot more stressed than she is now. Says that now that they have been back in school for a couple months she is actually feeling okay with things and really does not think that she needs any treatment for this.  No other complaints or concerns today.    09/09/2016: She reports that after her last physical she checked her records and  her prior colonoscopy was performed March 2007. This was performed up in [place]. Says that back then she was living and working up in that area but no longer is-- so would like to start getting these done in [place]. Also reports that she had been seeing GYN in [place] and would like for me to do her pelvic exam and Pap smear here. Was having her mammogram performed by the GYN in [place] but would like to start having this performed in [place] now.  Reports that for the past 1-2 weeks she has been feeling a little bit of burning and pressure in her low mid abdomen. Also some achiness in her right low back. Saturday (09/07/2016) the pain in her right low back would increase if she stepped a certain way-- but since then it is just been more of an achiness. "Thought it was a pulled muscle but not sure". No dysuria, no urinary urgency or frequency.  Reports that she has retired. Now is keeping her 42-month-old [name].  At end of visit asks if I will send refill on her BCP. Says it was started by her Gyn b/c of heavy menses. Discussed / Recommended stopping this given her age and risk of hormones. She requests that I refill for now and she "will think about it" Discussed risks associated with hormone therapy.   09/18/2017:  She reports that she has been feeling good and has no medical updates over the past year.  Everything has seemed stable. She does keep her 2 grandchildren 5 days a week.  One of them is almost 54 years old and the other is almost 54 year old. Reviewed that at her labs last year her vitamin D was low and recommended she start over-the-counter vitamin D 4000 units daily.  She states that she is taking this as directed.  Reviewed that also recommended decrease saturated fats in diet.  Today her response is "I could probably do better on that.  Probably need to cut out some snacks and junk food ". Reviewed that she had mammogram March 2018 at [facility].  This shows  up in epic but then I cannot actually read the report. Same is true for her colonoscopy.  I can see that this was performed 11/29/16 but cannot actually find the report.  Says this was also at [place] it did show a polyp and was told to repeat 3 years.   Review of Systems: Consitutional: No fever, chills, fatigue, night sweats, lymphadenopathy. No significant/unexplained weight changes. Eyes: No visual changes, eye redness, or discharge. ENT/Mouth: No ear pain, sore throat, nasal drainage, or sinus pain. Cardiovascular: No chest pressure,heaviness, tightness or squeezing, even with exertion. No increased shortness of breath or dyspnea on exertion.No palpitations, edema, orthopnea, PND. Respiratory: No cough, hemoptysis, SOB, or wheezing. Gastrointestinal: No anorexia, dysphagia, reflux, nausea, vomiting, hematemesis, diarrhea, constipation, BRBPR, or melena. Breast: No mass, nodules, bulging, or retraction. No skin changes or inflammation. No nipple discharge. No lymphadenopathy. Genitourinary: No dysuria, hematuria, incontinence, vaginal discharge, pruritis, burning, abnormal bleeding, or pain. Musculoskeletal: No decreased ROM, No joint pain or swelling. No significant pain in neck, back, or extremities. Skin: No rash, pruritis, or concerning lesions. Neurological: No headache, dizziness, syncope, seizures, tremors, memory loss, coordination problems, or paresthesias. Psychological: No hallucinations, SI/HI. See HPI.o/w negative. Endocrine: No polydipsia, polyphagia, polyuria, or known diabetes.No increased fatigue. No palpitations/rapid heart rate. No significant/unexplained weight change. All other systems were reviewed and are otherwise negative.  Past Medical History:  Diagnosis Date  . Allergy    seasonal  . Hypertension    pt states occasionally  . Kidney stones 09/2013     Past Surgical History:  Procedure Laterality Date  . CESAREAN SECTION    . COLONOSCOPY  WITH PROPOFOL N/A 11/29/2016   Procedure: COLONOSCOPY WITH PROPOFOL;  Surgeon: Jonathon Bellows, MD;  Location: [facility] ENDOSCOPY;  Service: Endoscopy;  Laterality: N/A;    Home Meds:  Outpatient Medications Prior to Visit  Medication Sig Dispense Refill  . Cholecalciferol 4000 units CAPS Take 1 capsule (4,000 Units total) by mouth daily. 30 capsule 0  . LORYNA 3-0.02 MG tablet TAKE 1 TABLET BY MOUTH ONCE DAILY 28 tablet 0   No facility-administered medications prior to visit.     Allergies: No Known Allergies  Social History   Socioeconomic History  . Marital status: Married    Spouse name: Not on file  . Number of children: Not on file  . Years of education: Not on file  . Highest education level: Not on file  Social Needs  . Financial resource strain: Not on file  . Food insecurity - worry: Not on file  . Food insecurity - inability: Not on file  . Transportation needs - medical: Not on file  . Transportation needs - non-medical: Not on file  Occupational History  . Not  on file  Tobacco Use  . Smoking status: Never Smoker  . Smokeless tobacco: Never Used  Substance and Sexual Activity  . Alcohol use: No  . Drug use: No  . Sexual activity: Yes    Birth control/protection: Pill  Other Topics Concern  . Not on file  Social History Narrative   Entered 2015:   Married. 67 son--17 y/o. 28 daughter--24 y/o.   Works as Location manager   does not exercise on a routine basis. 08/2017---Retired. Now keeps her grandchildren  Family History  Problem Relation Age of Onset  . Hyperlipidemia Mother     Physical Exam: Blood pressure 132/88, pulse 89, temperature 98.1 F (36.7 C), temperature source Oral, resp. rate 14, height 5' 5.5" (1.664 m), weight 64.2 kg (141 lb 9.6 oz), SpO2 99 %., Body mass index is 23.21 kg/m. General: Well developed, well nourished,White Female. Appears in no acute distress. HEENT: Normocephalic, atraumatic. Conjunctiva pink, sclera  non-icteric. Pupils 2 mm constricting to 1 mm, round, regular, and equally reactive to light and accomodation. EOMI. Internal auditory canal clear. TMs with good cone of light and without pathology. Nasal mucosa pink. Nares are without discharge. No sinus tenderness. Oral mucosa pink.  Pharynx without exudate.   Neck: Supple. Trachea midline. No thyromegaly. Full ROM. No lymphadenopathy.No Carotid Bruits. Lungs: Clear to auscultation bilaterally without wheezes, rales, or rhonchi. Breathing is of normal effort and unlabored. Cardiovascular: RRR with S1 S2. No murmurs, rubs, or gallops. Distal pulses 2+ symmetrically. No carotid or abdominal bruits. Breast: Inspection is normal with no skin changes. Palpation is normal with no mass. No nipple discharge. Abdomen: Soft, non-tender, non-distended with normoactive bowel sounds. No hepatosplenomegaly or masses. No rebound/guarding. No CVA tenderness. No hernias.  Genitourinary:  External genitalia normal. Vaginal mucosa normal. Cervix normal. Bimanual exam is normal with no adnexal mass or tenderness. Musculoskeletal: Full range of motion and 5/5 strength throughout.  Skin: Warm and moist without erythema, ecchymosis, wounds, or rash. Neuro: A+Ox3. CN II-XII grossly intact. Moves all extremities spontaneously. Full sensation throughout. Normal gait. Psych:  Responds to questions appropriately with a normal affect.   Assessment/Plan:  52 y.o. y/o female here for CPE 1. Visit for preventive health examination  A. Screening Labs: 09/18/2017: - CBC with Differential - COMPLETE METABOLIC PANEL WITH GFR - Lipid panel - TSH - Vitamin D  B. Pap:   09/18/2017: - performed Pap smear here 08/2016--.  Cytology negative.  HPV negative.--------- Per Guidelines: wait 3-5 years to repeat   C. Screening Mammogram: 09/18/2017: She had mammogram 09/2016.  Performed at [facility].  She is aware that needs annual follow-up and plans to have repeat mammogram  annually.  D. DEXA/BMD:  09/18/2017: Only age 74. Can wait to start doing this.  E. Colorectal Cancer Screening: 09/18/2017: She had colonoscopy performed at [facility] 11/29/16.  I see this in epic but cannot actually pull up the full report.  She reports that this showed a polyp and was told to repeat 3 years.  F. Immunizations:  Influenza:  09/18/2017: She wants to get this today.  Discussed that flu season is coming to an end but she wants to go ahead and get this today. Tetanus:   She states that she was given a tetanus at the school system in the past 5 years. I told her to try to get a record of this and fax it or bring it to our office for [place] to have on record. Pneumococcal:  She  has no indication to need pneumonia vaccine until age 1 Shingrix: 09/18/2017: Discussed this today.  I wrote this on her AVS and she is to contact her insurance to find out regarding coverage and cost then let us know.  Need for prophylactic vaccination and inoculation against influenza - Flu Vaccine QUAD 36+ mos PF IM (Fluarix Quad PF)   08/30/16:At end of visit asks if I will send refill on her BCP. Says it was started by her Gyn b/c of heavy menses. Discussed / Recommended stopping this given her age and risk of hormones. She requests that I refill for now and she "will think about it" Discussed risks associated with hormone therapy.   Follow Up office visit one year or sooner if needed.    Marin Olp Kendall, [place], [facility] 09/18/2017 8:43 AM

## 2017-09-18 NOTE — Addendum Note (Signed)
Addended by: Vonna Kotyk A on: 09/18/2017 05:06 PM   Modules accepted: Orders

## 2017-09-19 LAB — CBC WITH DIFFERENTIAL/PLATELET
Basophils Absolute: 48 cells/uL (ref 0–200)
Basophils Relative: 0.7 %
EOS ABS: 97 {cells}/uL (ref 15–500)
Eosinophils Relative: 1.4 %
HCT: 42.3 % (ref 35.0–45.0)
Hemoglobin: 14.5 g/dL (ref 11.7–15.5)
Lymphs Abs: 1566 cells/uL (ref 850–3900)
MCH: 31.5 pg (ref 27.0–33.0)
MCHC: 34.3 g/dL (ref 32.0–36.0)
MCV: 92 fL (ref 80.0–100.0)
MONOS PCT: 5.3 %
MPV: 9.7 fL (ref 7.5–12.5)
NEUTROS PCT: 69.9 %
Neutro Abs: 4823 cells/uL (ref 1500–7800)
PLATELETS: 307 10*3/uL (ref 140–400)
RBC: 4.6 10*6/uL (ref 3.80–5.10)
RDW: 12.4 % (ref 11.0–15.0)
TOTAL LYMPHOCYTE: 22.7 %
WBC mixed population: 366 cells/uL (ref 200–950)
WBC: 6.9 10*3/uL (ref 3.8–10.8)

## 2017-09-19 LAB — COMPLETE METABOLIC PANEL WITH GFR
AG RATIO: 1.7 (calc) (ref 1.0–2.5)
ALKALINE PHOSPHATASE (APISO): 46 U/L (ref 33–130)
ALT: 11 U/L (ref 6–29)
AST: 10 U/L (ref 10–35)
Albumin: 4 g/dL (ref 3.6–5.1)
BILIRUBIN TOTAL: 0.3 mg/dL (ref 0.2–1.2)
BUN: 15 mg/dL (ref 7–25)
CHLORIDE: 106 mmol/L (ref 98–110)
CO2: 25 mmol/L (ref 20–32)
Calcium: 9.2 mg/dL (ref 8.6–10.4)
Creat: 0.81 mg/dL (ref 0.50–1.05)
GFR, Est African American: 96 mL/min/{1.73_m2} (ref >=60)
GFR, Est Non African American: 83 mL/min/{1.73_m2} (ref >=60)
GLUCOSE: 97 mg/dL (ref 65–99)
Globulin: 2.4 g/dL (calc) (ref 1.9–3.7)
POTASSIUM: 4.7 mmol/L (ref 3.5–5.3)
Sodium: 138 mmol/L (ref 135–146)
TOTAL PROTEIN: 6.4 g/dL (ref 6.1–8.1)

## 2017-09-19 LAB — LIPID PANEL
CHOLESTEROL: 255 mg/dL — AB (ref <200)
HDL: 58 mg/dL (ref >50)
LDL CHOLESTEROL (CALC): 151 mg/dL — AB
Non-HDL Cholesterol (Calc): 197 mg/dL (calc) — ABNORMAL HIGH (ref <130)
TRIGLYCERIDES: 300 mg/dL — AB (ref <150)
Total CHOL/HDL Ratio: 4.4 (calc) (ref <5.0)

## 2017-09-19 LAB — VITAMIN D 25 HYDROXY (VIT D DEFICIENCY, FRACTURES): Vit D, 25-Hydroxy: 34 ng/mL (ref 30–100)

## 2017-09-19 LAB — TSH: TSH: 2.64 mIU/L

## 2017-09-22 ENCOUNTER — Other Ambulatory Visit: Payer: Self-pay | Admitting: Physician Assistant

## 2017-10-25 ENCOUNTER — Other Ambulatory Visit: Payer: Self-pay | Admitting: Physician Assistant

## 2017-11-12 ENCOUNTER — Other Ambulatory Visit: Payer: Self-pay | Admitting: Physician Assistant

## 2017-11-12 DIAGNOSIS — Z1231 Encounter for screening mammogram for malignant neoplasm of breast: Secondary | ICD-10-CM

## 2017-12-01 ENCOUNTER — Ambulatory Visit
Admission: RE | Admit: 2017-12-01 | Discharge: 2017-12-01 | Disposition: A | Discharge status: Home / Self Care | Payer: BC Managed Care – PPO | Source: Ambulatory Visit | Attending: Physician Assistant | Admitting: Physician Assistant

## 2017-12-01 DIAGNOSIS — Z1231 Encounter for screening mammogram for malignant neoplasm of breast: Secondary | ICD-10-CM | POA: Diagnosis present

## 2018-08-10 ENCOUNTER — Other Ambulatory Visit: Payer: Self-pay | Admitting: Family Medicine

## 2018-08-10 DIAGNOSIS — Z1231 Encounter for screening mammogram for malignant neoplasm of breast: Secondary | ICD-10-CM

## 2018-09-21 ENCOUNTER — Encounter: Payer: BC Managed Care – PPO | Admitting: Physician Assistant

## 2019-03-02 ENCOUNTER — Ambulatory Visit
Admission: RE | Admit: 2019-03-02 | Discharge: 2019-03-02 | Disposition: A | Discharge status: Home / Self Care | Payer: BC Managed Care – PPO | Source: Ambulatory Visit | Attending: Family Medicine | Admitting: Family Medicine

## 2019-03-02 DIAGNOSIS — E782 Mixed hyperlipidemia: Secondary | ICD-10-CM | POA: Insufficient documentation

## 2019-03-02 DIAGNOSIS — Z1231 Encounter for screening mammogram for malignant neoplasm of breast: Secondary | ICD-10-CM | POA: Diagnosis not present

## 2019-07-19 ENCOUNTER — Ambulatory Visit: Payer: BC Managed Care – PPO | Attending: Internal Medicine

## 2019-07-19 DIAGNOSIS — Z20822 Contact with and (suspected) exposure to covid-19: Secondary | ICD-10-CM

## 2019-07-20 ENCOUNTER — Other Ambulatory Visit: Payer: BC Managed Care – PPO

## 2019-07-20 LAB — NOVEL CORONAVIRUS, NAA: SARS-CoV-2, NAA: NOT DETECTED

## 2019-12-28 ENCOUNTER — Other Ambulatory Visit: Payer: Self-pay

## 2019-12-28 DIAGNOSIS — Z860100 Personal history of colon polyps, unspecified: Secondary | ICD-10-CM

## 2019-12-29 ENCOUNTER — Other Ambulatory Visit (INDEPENDENT_AMBULATORY_CARE_PROVIDER_SITE_OTHER): Payer: Self-pay

## 2019-12-29 ENCOUNTER — Telehealth (INDEPENDENT_AMBULATORY_CARE_PROVIDER_SITE_OTHER): Payer: Self-pay | Admitting: Gastroenterology

## 2019-12-29 DIAGNOSIS — Z86010 Personal history of colonic polyps: Secondary | ICD-10-CM

## 2019-12-29 NOTE — Progress Notes (Signed)
Gastroenterology Pre-Procedure Review  Request Date: Thursday 01/20/20 Requesting Physician: Dr. Vicente Males  PATIENT REVIEW QUESTIONS: The patient responded to the following health history questions as indicated:    1. Are you having any GI issues? no 2. Do you have a personal history of Polyps? yes (Nov 29 2016) 3. Do you have a family history of Colon Cancer or Polyps? yes (aunt had colon polyps) 4. Diabetes Mellitus? no 5. Joint replacements in the past 12 months?no 6. Major health problems in the past 3 months?no 7. Any artificial heart valves, MVP, or defibrillator?no    MEDICATIONS & ALLERGIES:    Patient reports the following regarding taking any anticoagulation/antiplatelet therapy:   Plavix, Coumadin, Eliquis, Xarelto, Lovenox, Pradaxa, Brilinta, or Effient? no Aspirin? no  Patient confirms/reports the following medications:  Current Outpatient Medications  Medication Sig Dispense Refill  . atorvastatin (LIPITOR) 40 MG tablet     . Cholecalciferol 4000 units CAPS Take 1 capsule (4,000 Units total) by mouth daily. 30 capsule 0  . Inulin-Cholecalciferol 2.5-500 GM-UNIT CHEW Chew by mouth.    . LORYNA 3-0.02 MG tablet TAKE 1 TABLET BY MOUTH EVERY DAY 28 tablet 0  . meloxicam (MOBIC) 15 MG tablet Take by mouth.     No current facility-administered medications for this visit.    Patient confirms/reports the following allergies:  No Known Allergies  No orders of the defined types were placed in this encounter.   AUTHORIZATION INFORMATION Primary Insurance: 1D#: Group #:  Secondary Insurance: 1D#: Group #:  SCHEDULE INFORMATION: Date: 01/20/20 Time: Location:[facility]

## 2020-01-18 ENCOUNTER — Other Ambulatory Visit
Admission: RE | Admit: 2020-01-18 | Discharge: 2020-01-18 | Disposition: A | Discharge status: Home / Self Care | Payer: BC Managed Care – PPO | Source: Ambulatory Visit | Attending: Gastroenterology | Admitting: Gastroenterology

## 2020-01-18 ENCOUNTER — Other Ambulatory Visit: Payer: Self-pay

## 2020-01-18 DIAGNOSIS — Z20822 Contact with and (suspected) exposure to covid-19: Secondary | ICD-10-CM | POA: Diagnosis not present

## 2020-01-18 DIAGNOSIS — Z01812 Encounter for preprocedural laboratory examination: Secondary | ICD-10-CM | POA: Diagnosis present

## 2020-01-18 LAB — SARS CORONAVIRUS 2 (TAT 6-24 HRS): SARS Coronavirus 2: NEGATIVE

## 2020-01-19 ENCOUNTER — Encounter: Payer: Self-pay | Admitting: Gastroenterology

## 2020-01-20 ENCOUNTER — Ambulatory Visit: Payer: BC Managed Care – PPO | Admitting: Anesthesiology

## 2020-01-20 ENCOUNTER — Other Ambulatory Visit: Payer: Self-pay

## 2020-01-20 ENCOUNTER — Encounter
Admission: RE | Disposition: A | Discharge status: Home / Self Care | Payer: Self-pay | Source: Home / Self Care | Attending: Gastroenterology

## 2020-01-20 ENCOUNTER — Encounter: Payer: Self-pay | Admitting: Gastroenterology

## 2020-01-20 ENCOUNTER — Ambulatory Visit
Admission: RE | Admit: 2020-01-20 | Discharge: 2020-01-20 | Disposition: A | Discharge status: Home / Self Care | Payer: BC Managed Care – PPO | Attending: Gastroenterology | Admitting: Gastroenterology

## 2020-01-20 DIAGNOSIS — Z8349 Family history of other endocrine, nutritional and metabolic diseases: Secondary | ICD-10-CM | POA: Insufficient documentation

## 2020-01-20 DIAGNOSIS — Z86010 Personal history of colonic polyps: Secondary | ICD-10-CM | POA: Diagnosis not present

## 2020-01-20 DIAGNOSIS — Z1211 Encounter for screening for malignant neoplasm of colon: Secondary | ICD-10-CM | POA: Diagnosis not present

## 2020-01-20 DIAGNOSIS — Z79899 Other long term (current) drug therapy: Secondary | ICD-10-CM | POA: Insufficient documentation

## 2020-01-20 DIAGNOSIS — I10 Essential (primary) hypertension: Secondary | ICD-10-CM | POA: Diagnosis not present

## 2020-01-20 DIAGNOSIS — E785 Hyperlipidemia, unspecified: Secondary | ICD-10-CM | POA: Insufficient documentation

## 2020-01-20 HISTORY — DX: Hyperlipidemia, unspecified: E78.5

## 2020-01-20 HISTORY — PX: COLONOSCOPY WITH PROPOFOL: SHX5780

## 2020-01-20 SURGERY — COLONOSCOPY WITH PROPOFOL
Anesthesia: General

## 2020-01-20 MED ORDER — PROPOFOL 500 MG/50ML IV EMUL
INTRAVENOUS | Status: DC | PRN
  Administered 2020-01-20: 150 ug/kg/min via INTRAVENOUS

## 2020-01-20 MED ORDER — PROPOFOL 500 MG/50ML IV EMUL
INTRAVENOUS | Status: AC
  Filled 2020-01-20: qty 50

## 2020-01-20 MED ORDER — SODIUM CHLORIDE 0.9 % IV SOLN
INTRAVENOUS | Status: DC
  Administered 2020-01-20: 1000 mL via INTRAVENOUS

## 2020-01-20 NOTE — Progress Notes (Signed)
°   01/20/20 0750  Clinical Encounter Type  Visited With Family  Visit Type Initial  Referral From Chaplain  Consult/Referral To Chaplain  Chaplain briefly spoke with husband, told him that she wanted to check and make sure he was alright and he said yes.

## 2020-01-20 NOTE — Op Note (Signed)
[facility] Gastroenterology Patient Name: Tabitha Warren Procedure Date: 01/20/2020 9:13 AM MRN: [id number] Account #: [id number] Date of Birth: 07/06/1966 Admit Type: Outpatient Age: 54 Room: [facility] ENDO ROOM 3 Gender: Female Note Status: Finalized Procedure:             Colonoscopy Indications:           High risk colon cancer surveillance: Personal history                         of colonic polyps, Last colonoscopy: May 2018 Providers:             Jonathon Bellows MD, MD Referring MD:          Lonie Peak. Doren Custard (Referring MD) Medicines:             Monitored Anesthesia Care Complications:         No immediate complications. Procedure:             Pre-Anesthesia Assessment:                        - Prior to the procedure, a History and Physical was                         performed, and patient medications, allergies and                         sensitivities were reviewed. The patient's tolerance                         of previous anesthesia was reviewed.                        - The risks and benefits of the procedure and the                         sedation options and risks were discussed with the                         patient. All questions were answered and informed                         consent was obtained.                        - ASA Grade Assessment: II - A patient with mild                         systemic disease.                        After obtaining informed consent, the colonoscope was                         passed under direct vision. Throughout the procedure,                         the patient's blood pressure, pulse, and oxygen                         saturations were monitored  continuously. The                         Colonoscope was introduced through the anus and                         advanced to the the cecum, identified by the                         appendiceal orifice. The colonoscopy was performed                         with ease. The  patient tolerated the procedure well.                         The quality of the bowel preparation was excellent. Findings:      The perianal and digital rectal examinations were normal.      The entire examined colon appeared normal on direct and retroflexion       views. Impression:            - The entire examined colon is normal on direct and                         retroflexion views.                        - No specimens collected. Recommendation:        - Discharge patient to home.                        - Advance diet as tolerated.                        - Continue present medications.                        - Repeat colonoscopy in 5 years for surveillance. Procedure Code(s):     --- Professional ---                        [phone], Colonoscopy, flexible; diagnostic, including                         collection of specimen(s) by brushing or washing, when                         performed (separate procedure) Diagnosis Code(s):     --- Professional ---                        Z86.010, Personal history of colonic polyps CPT copyright 2019 American Medical Association. All rights reserved. The codes documented in this report are preliminary and upon coder review may  be revised to meet current compliance requirements. Jonathon Bellows, MD Jonathon Bellows MD, MD 01/20/2020 9:30:00 AM This report has been signed electronically. Number of Addenda: 0 Note Initiated On: 01/20/2020 9:13 AM Scope Withdrawal Time: 0 hours 8 minutes 19 seconds  Total Procedure Duration: 0 hours 10 minutes 10 seconds  Estimated Blood Loss:  Estimated blood loss: none.      [facility]

## 2020-01-20 NOTE — Anesthesia Procedure Notes (Signed)
Performed by: Cook-Martin, Abbagale Goguen Pre-anesthesia Checklist: Patient identified, Emergency Drugs available, Suction available, Patient being monitored and Timeout performed Patient Re-evaluated:Patient Re-evaluated prior to induction Oxygen Delivery Method: Nasal cannula Preoxygenation: Pre-oxygenation with 100% oxygen Placement Confirmation: positive ETCO2 and CO2 detector       

## 2020-01-20 NOTE — H&P (Signed)
Jonathon Bellows, MD [address], [place], [place], [place], [address] [address], [place], [place], [place], [address] Phone: [phone]  Fax: [phone]  Primary Care Physician:  Orlena Sheldon, PA-C   Pre-Procedure History & Physical: HPI:  Tabitha Warren is a 54 y.o. female is here for an colonoscopy.   Past Medical History:  Diagnosis Date  . Allergy    seasonal  . Hyperlipidemia   . Hypertension    pt states occasionally  . Kidney stones 09/2013    Past Surgical History:  Procedure Laterality Date  . CESAREAN SECTION    . COLONOSCOPY WITH PROPOFOL N/A 11/29/2016   Procedure: COLONOSCOPY WITH PROPOFOL;  Surgeon: Jonathon Bellows, MD;  Location: [facility] ENDOSCOPY;  Service: Endoscopy;  Laterality: N/A;    Prior to Admission medications   Medication Sig Start Date End Date Taking? Authorizing Provider  atorvastatin (LIPITOR) 40 MG tablet  06/01/19  Yes [provider]  Cholecalciferol 4000 units CAPS Take 1 capsule (4,000 Units total) by mouth daily. 09/11/16  Yes Orlena Sheldon, PA-C  meloxicam (MOBIC) 15 MG tablet Take by mouth. 12/07/19 12/06/20  [provider]    Allergies as of 12/29/2019  . (No Known Allergies)    Family History  Problem Relation Age of Onset  . Hyperlipidemia Mother     Social History   Socioeconomic History  . Marital status: Married    Spouse name: Not on file  . Number of children: Not on file  . Years of education: Not on file  . Highest education level: Not on file  Occupational History  . Not on file  Tobacco Use  . Smoking status: Never Smoker  . Smokeless tobacco: Never Used  Substance and Sexual Activity  . Alcohol use: No  . Drug use: No  . Sexual activity: Yes    Birth control/protection: Pill  Other Topics Concern  . Not on file  Social History Narrative   Entered 2015:   Married. 78 son--17 y/o. 13 daughter--24 y/o.   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 Difficulty of Paying Living Expenses:   Food Insecurity:   . Worried About Charity fundraiser in the Last Year:   . Arboriculturist in the Last Year:   Transportation Needs:   . Film/video editor (Medical):   Marland Kitchen Lack of Transportation (Non-Medical):   Physical Activity:   . Days of Exercise per Week:   . Minutes of Exercise per Session:   Stress:   . Feeling of Stress :   Social Connections:   . Frequency of Communication with Friends and Family:   . Frequency of Social Gatherings with Friends and Family:   . Attends Religious Services:   . Active Member of Clubs or Organizations:   . Attends Archivist Meetings:   Marland Kitchen Marital Status:   Intimate Partner Violence:   . Fear of Current or Ex-Partner:   . Emotionally Abused:   Marland Kitchen Physically Abused:   . Sexually Abused:     Review of Systems: See HPI, otherwise negative ROS  Physical Exam: BP (!) 147/94   Pulse 84   Temp (!) 96.4 F (35.8 C) (Tympanic)   Resp 18   Ht 5\' 4"  (1.626 m)   Wt 72.6 kg   LMP 05/09/2014   SpO2 100%   BMI 27.46 kg/m  General:   Alert,  pleasant  and cooperative in NAD Head:  Normocephalic and atraumatic. Neck:  Supple; no masses or thyromegaly. Lungs:  Clear throughout to auscultation, normal respiratory effort.    Heart:  +S1, +S2, Regular rate and rhythm, No edema. Abdomen:  Soft, nontender and nondistended. Normal bowel sounds, without guarding, and without rebound.   Neurologic:  Alert and  oriented x4;  grossly normal neurologically.  Impression/Plan: TAQWA DEEM is here for an colonoscopy to be performed for surveillance due to prior history of colon polyps   Risks, benefits, limitations, and alternatives regarding  colonoscopy have been reviewed with the patient.  Questions have been answered.  All parties agreeable.   Jonathon Bellows, MD  01/20/2020, 9:02 AM

## 2020-01-20 NOTE — Anesthesia Postprocedure Evaluation (Signed)
Anesthesia Post Note  Patient: ALEXZIA KASLER  Procedure(s) Performed: COLONOSCOPY WITH PROPOFOL (N/A )  Patient location during evaluation: Endoscopy Anesthesia Type: General Level of consciousness: awake and alert Pain management: pain level controlled Vital Signs Assessment: post-procedure vital signs reviewed and stable Respiratory status: spontaneous breathing, nonlabored ventilation, respiratory function stable and patient connected to nasal cannula oxygen Cardiovascular status: blood pressure returned to baseline and stable Postop Assessment: no apparent nausea or vomiting Anesthetic complications: no   No complications documented.   Last Vitals:  Vitals:   01/20/20 0943 01/20/20 1002  BP: 126/84 124/78  Pulse:    Resp:    Temp:    SpO2:      Last Pain:  Vitals:   01/20/20 1002  TempSrc:   PainSc: 0-No pain                 Precious Haws Zakyah Yanes

## 2020-01-20 NOTE — Anesthesia Preprocedure Evaluation (Signed)
Anesthesia Evaluation  Patient identified by MRN, date of birth, ID band Patient awake    Reviewed: Allergy & Precautions, H&P , NPO status , Patient's Chart, lab work & pertinent test results  History of Anesthesia Complications Negative for: history of anesthetic complications  Airway Mallampati: II  TM Distance: >3 FB Neck ROM: full    Dental  (+) Chipped   Pulmonary neg pulmonary ROS, neg shortness of breath,    Pulmonary exam normal        Cardiovascular Exercise Tolerance: Good hypertension, (-) angina(-) Past MI Normal cardiovascular exam     Neuro/Psych negative neurological ROS  negative psych ROS   GI/Hepatic negative GI ROS, Neg liver ROS, neg GERD  ,  Endo/Other  negative endocrine ROS  Renal/GU Renal diseasestones  negative genitourinary   Musculoskeletal negative musculoskeletal ROS (+)   Abdominal Normal abdominal exam  (+)   Peds negative pediatric ROS (+)  Hematology negative hematology ROS (+)   Anesthesia Other Findings Past Medical History: No date: Allergy     Comment:  seasonal No date: Hyperlipidemia No date: Hypertension     Comment:  pt states occasionally 09/2013: Kidney stones  Past Surgical History: No date: CESAREAN SECTION 11/29/2016: COLONOSCOPY WITH PROPOFOL; N/A     Comment:  Procedure: COLONOSCOPY WITH PROPOFOL;  Surgeon: Jonathon Bellows, MD;  Location: [facility] ENDOSCOPY;  Service:               Endoscopy;  Laterality: N/A;     Reproductive/Obstetrics negative OB ROS                             Anesthesia Physical  Anesthesia Plan  ASA: II  Anesthesia Plan: General   Post-op Pain Management:    Induction: Intravenous  PONV Risk Score and Plan: Propofol infusion and TIVA  Airway Management Planned: Nasal Cannula  Additional Equipment:   Intra-op Plan:   Post-operative Plan:   Informed Consent: I have reviewed the  patients History and Physical, chart, labs and discussed the procedure including the risks, benefits and alternatives for the proposed anesthesia with the patient or authorized representative who has indicated his/her understanding and acceptance.     Dental advisory given  Plan Discussed with: CRNA and Surgeon  Anesthesia Plan Comments: (Patient consented for risks of anesthesia including but not limited to:  - adverse reactions to medications - risk of intubation if required - damage to eyes, teeth, lips or other oral mucosa - nerve damage due to positioning  - sore throat or hoarseness - Damage to heart, brain, nerves, lungs, other parts of body or loss of life  Patient voiced understanding.)        Anesthesia Quick Evaluation

## 2020-01-20 NOTE — Transfer of Care (Signed)
Immediate Anesthesia Transfer of Care Note  Patient: Tabitha Warren  Procedure(s) Performed: COLONOSCOPY WITH PROPOFOL (N/A )  Patient Location: PACU  Anesthesia Type:General  Level of Consciousness: awake and sedated  Airway & Oxygen Therapy: Patient Spontanous Breathing and Patient connected to nasal cannula oxygen  Post-op Assessment: Report given to RN and Post -op Vital signs reviewed and stable  Post vital signs: Reviewed and stable  Last Vitals:  Vitals Value Taken Time  BP    Temp    Pulse    Resp    SpO2      Last Pain:  Vitals:   01/20/20 0750  TempSrc: Tympanic  PainSc: 0-No pain         Complications: No complications documented.

## 2020-01-21 ENCOUNTER — Encounter: Payer: Self-pay | Admitting: Gastroenterology

## 2020-02-28 ENCOUNTER — Other Ambulatory Visit: Payer: Self-pay | Admitting: Student

## 2020-02-28 DIAGNOSIS — Z1231 Encounter for screening mammogram for malignant neoplasm of breast: Secondary | ICD-10-CM

## 2020-03-17 ENCOUNTER — Ambulatory Visit
Admission: RE | Admit: 2020-03-17 | Discharge: 2020-03-17 | Disposition: A | Discharge status: Home / Self Care | Payer: BC Managed Care – PPO | Source: Ambulatory Visit | Attending: Student | Admitting: Student

## 2020-03-17 ENCOUNTER — Other Ambulatory Visit: Payer: Self-pay

## 2020-03-17 DIAGNOSIS — Z1231 Encounter for screening mammogram for malignant neoplasm of breast: Secondary | ICD-10-CM | POA: Diagnosis present

## 2021-05-02 ENCOUNTER — Other Ambulatory Visit: Payer: Self-pay | Admitting: Student

## 2021-05-02 DIAGNOSIS — Z1231 Encounter for screening mammogram for malignant neoplasm of breast: Secondary | ICD-10-CM

## 2021-05-21 ENCOUNTER — Ambulatory Visit
Admission: RE | Admit: 2021-05-21 | Discharge: 2021-05-21 | Disposition: A | Discharge status: Home / Self Care | Payer: BC Managed Care – PPO | Source: Ambulatory Visit | Attending: Student | Admitting: Student

## 2021-05-21 ENCOUNTER — Other Ambulatory Visit: Payer: Self-pay

## 2021-05-21 DIAGNOSIS — Z1231 Encounter for screening mammogram for malignant neoplasm of breast: Secondary | ICD-10-CM | POA: Diagnosis not present

## 2022-05-26 IMAGING — MG MM DIGITAL SCREENING BILAT W/ TOMO AND CAD
8 series · 9 of 24 positions shown · non-contrast
Comparison: Previous exam(s).

CLINICAL DATA: Screening.

EXAM:
DIGITAL SCREENING BILATERAL MAMMOGRAM WITH TOMOSYNTHESIS AND CAD
TECHNIQUE: Bilateral screening digital craniocaudal and mediolateral oblique
mammograms were obtained. Bilateral screening digital breast
tomosynthesis was performed. The images were evaluated with
computer-aided detection.

[R CC synth-2D]
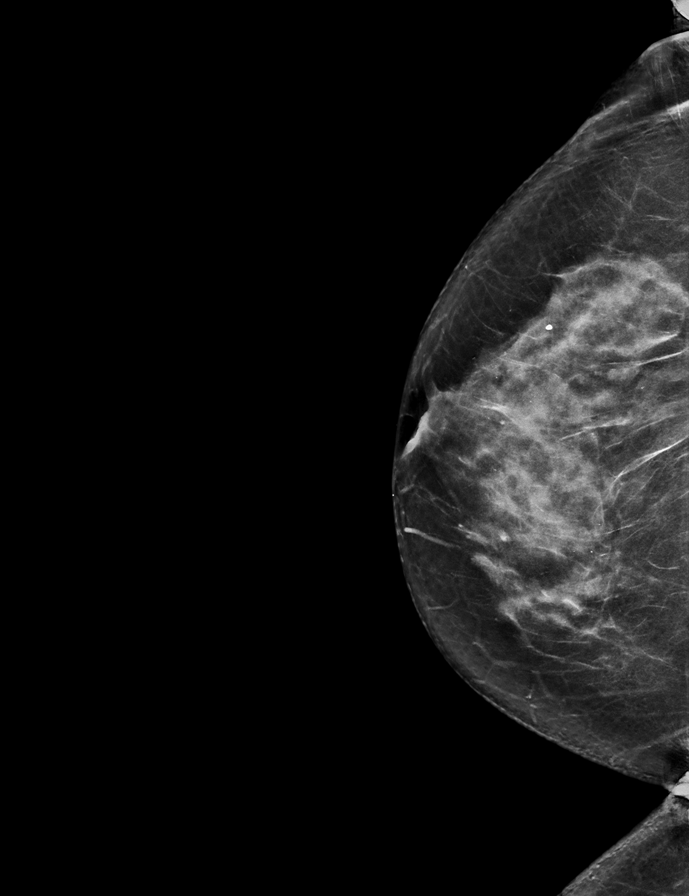

[L CC synth-2D]
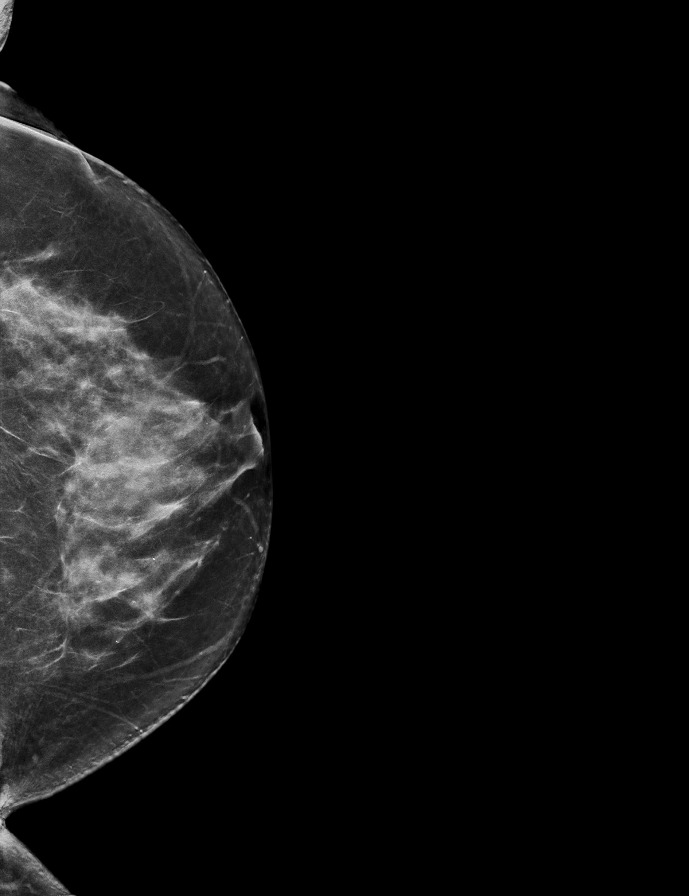

[L MLO synth-2D]
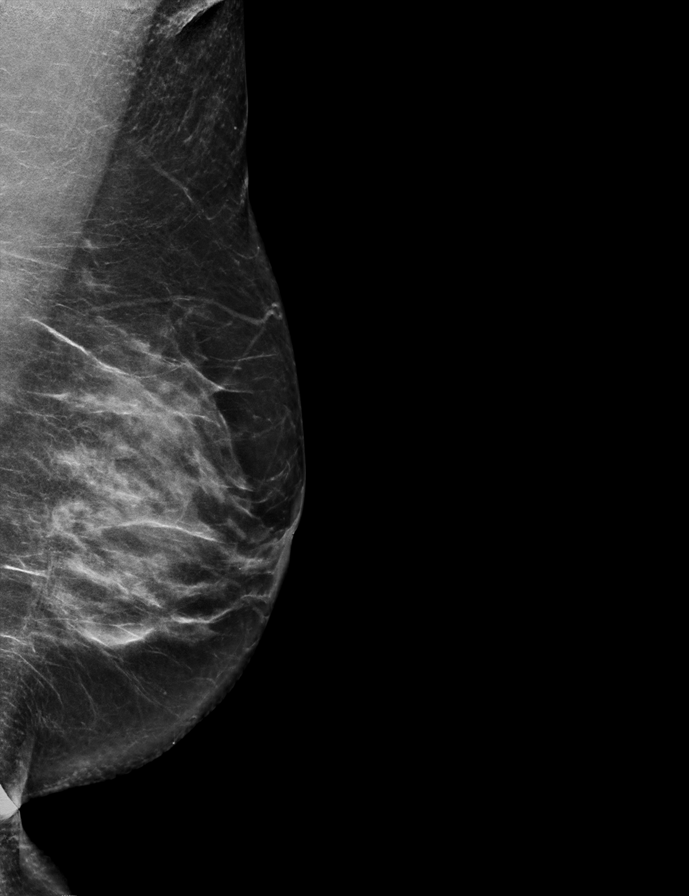

[R MLO synth-2D]
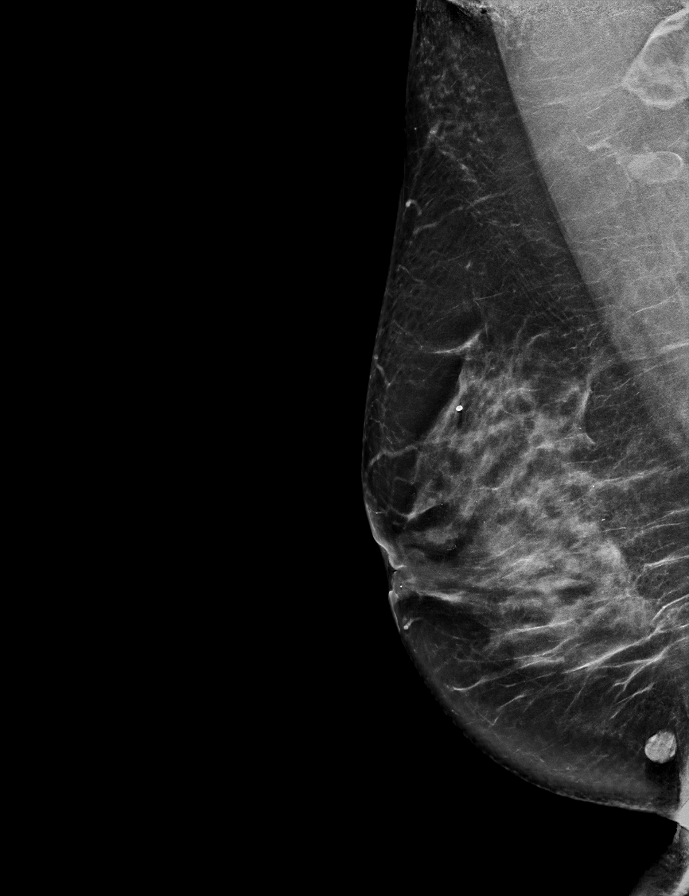

[R CC tomo · 2 of 69 frames shown]
[frame 23/69]
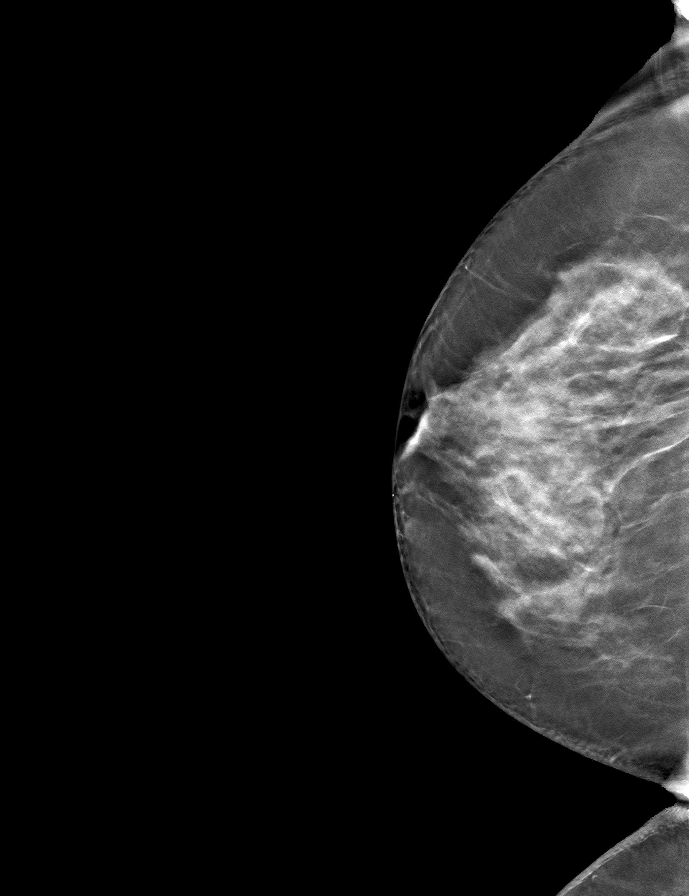
[frame 35/69]
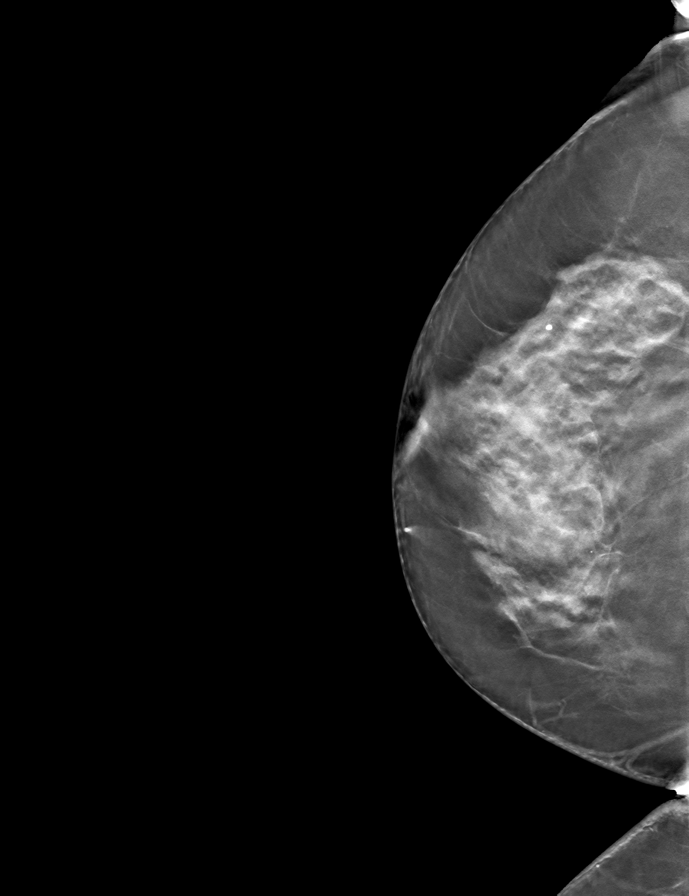

[L CC tomo · tomo slice 35/69.0]
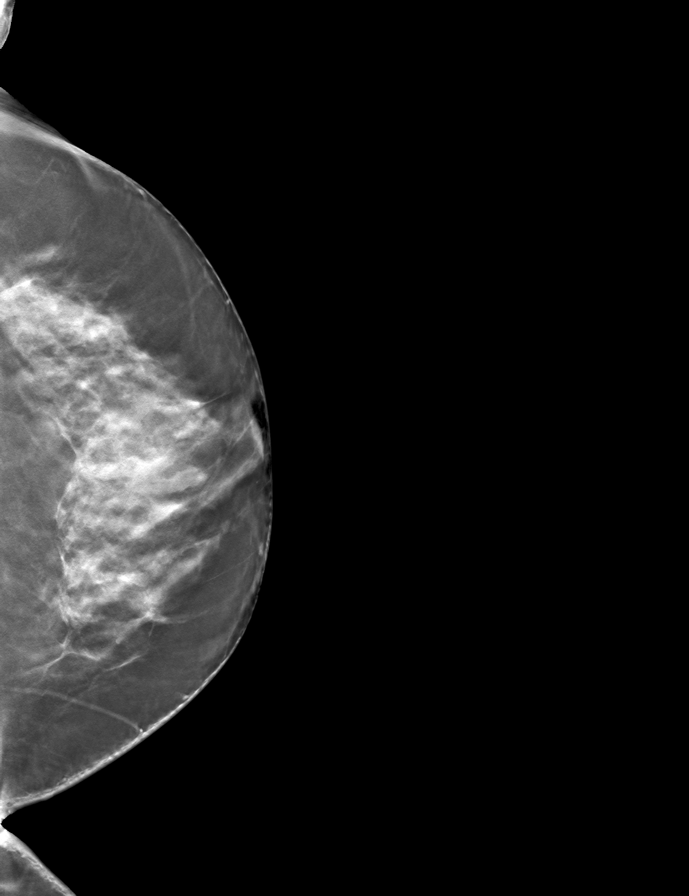

[R MLO tomo · tomo slice 35/69.0]
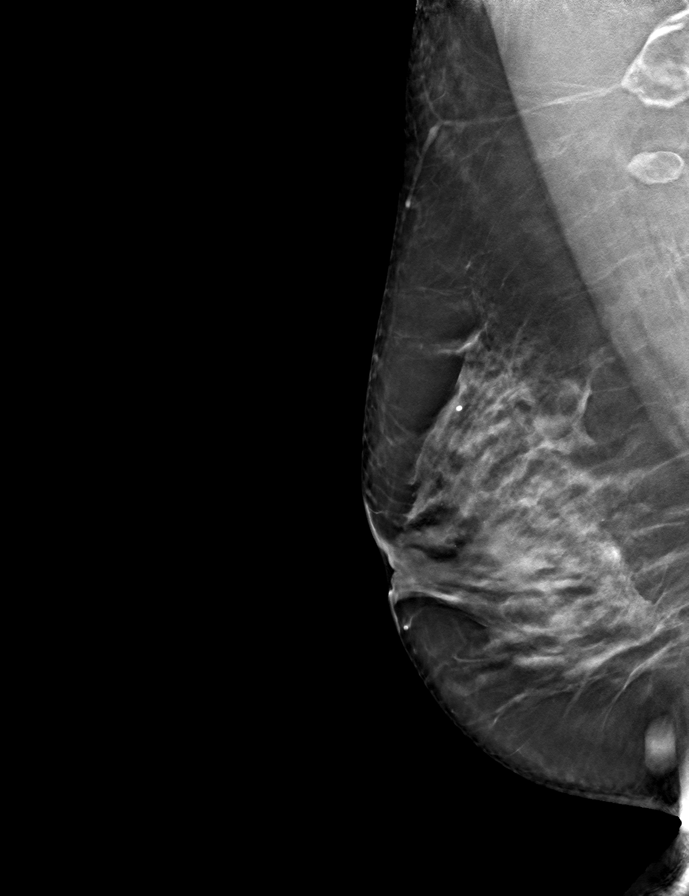

[L MLO tomo · tomo slice 36/71.0]
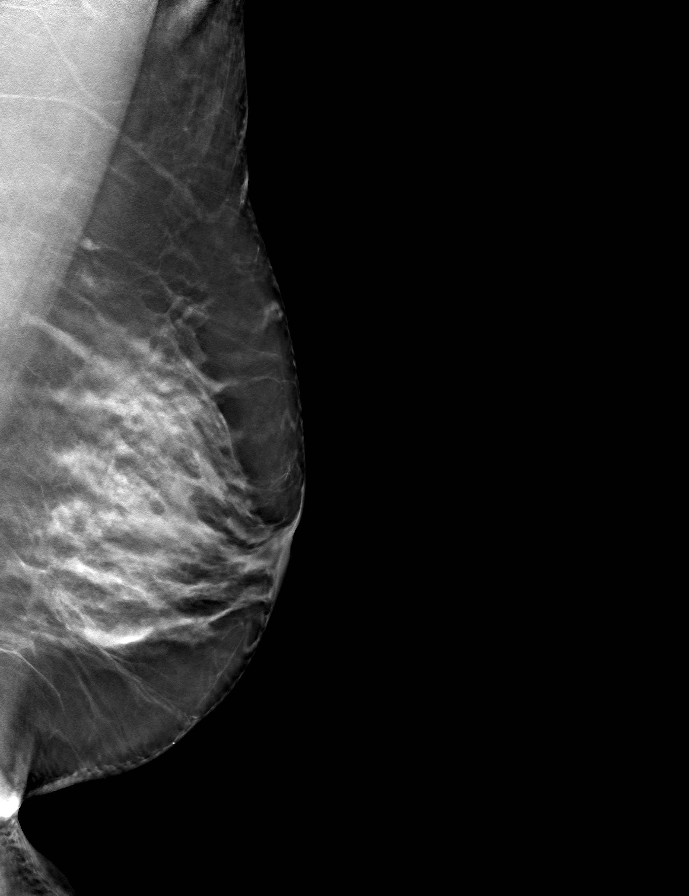

[9 of 24 positions shown; findings below may reference images not displayed]

ACR Breast Density Category d: The breast tissue is extremely dense,
which lowers the sensitivity of mammography
FINDINGS: There are no findings suspicious for malignancy.
IMPRESSION: No mammographic evidence of malignancy. A result letter of this
screening mammogram will be mailed directly to the patient.

RECOMMENDATION:
Screening mammogram in one year. (Code:[id number]-[id number])

BI-RADS CATEGORY  1: Negative.

## 2022-07-03 ENCOUNTER — Other Ambulatory Visit: Payer: Self-pay | Admitting: Student

## 2022-07-03 DIAGNOSIS — Z1231 Encounter for screening mammogram for malignant neoplasm of breast: Secondary | ICD-10-CM

## 2022-07-17 ENCOUNTER — Ambulatory Visit
Admission: RE | Admit: 2022-07-17 | Discharge: 2022-07-17 | Disposition: A | Discharge status: Home / Self Care | Payer: BC Managed Care – PPO | Source: Ambulatory Visit | Attending: Student | Admitting: Student

## 2022-07-17 DIAGNOSIS — Z1231 Encounter for screening mammogram for malignant neoplasm of breast: Secondary | ICD-10-CM | POA: Insufficient documentation

## 2023-09-03 ENCOUNTER — Other Ambulatory Visit: Payer: Self-pay | Admitting: Student

## 2023-09-03 DIAGNOSIS — Z1231 Encounter for screening mammogram for malignant neoplasm of breast: Secondary | ICD-10-CM

## 2023-09-10 ENCOUNTER — Ambulatory Visit
Admission: RE | Admit: 2023-09-10 | Discharge: 2023-09-10 | Disposition: A | Discharge status: Home / Self Care | Payer: 59 | Source: Ambulatory Visit | Attending: Student | Admitting: Student

## 2023-09-10 DIAGNOSIS — Z1231 Encounter for screening mammogram for malignant neoplasm of breast: Secondary | ICD-10-CM | POA: Insufficient documentation

## 2024-06-26 ENCOUNTER — Ambulatory Visit
Admission: EM | Admit: 2024-06-26 | Discharge: 2024-06-26 | Disposition: A | Discharge status: Home / Self Care | Attending: Nurse Practitioner | Admitting: Nurse Practitioner

## 2024-06-26 DIAGNOSIS — N898 Other specified noninflammatory disorders of vagina: Secondary | ICD-10-CM | POA: Diagnosis present

## 2024-06-26 DIAGNOSIS — R350 Frequency of micturition: Secondary | ICD-10-CM | POA: Diagnosis present

## 2024-06-26 DIAGNOSIS — N3001 Acute cystitis with hematuria: Secondary | ICD-10-CM | POA: Insufficient documentation

## 2024-06-26 LAB — POCT URINE DIPSTICK
Bilirubin, UA: NEGATIVE
Glucose, UA: NEGATIVE mg/dL
Nitrite, UA: POSITIVE — AB
POC PROTEIN,UA: 30 — AB
Spec Grav, UA: 1.025 (ref 1.010–1.025)
Urobilinogen, UA: 0.2 U/dL
pH, UA: 5.5 (ref 5.0–8.0)

## 2024-06-26 MED ORDER — PHENAZOPYRIDINE HCL 200 MG PO TABS: 200.0000 mg | ORAL_TABLET | ORAL | 0 refills | Status: AC

## 2024-06-26 MED ORDER — NITROFURANTOIN MONOHYD MACRO 100 MG PO CAPS: 100.0000 mg | ORAL_CAPSULE | Freq: Two times a day (BID) | ORAL | 0 refills | Status: AC

## 2024-06-26 MED ORDER — FLUCONAZOLE 150 MG PO TABS: 150.0000 mg | ORAL_TABLET | ORAL | 0 refills | Status: AC

## 2024-06-26 NOTE — Discharge Instructions (Addendum)
 You were seen today for symptoms consistent with a urinary tract infection (UTI). You have been prescribed Macrobid  to treat the infection and Pyridium  to help relieve discomfort such as burning, urgency, and bladder pressure. Take the antibiotics exactly as prescribed and complete the full course, even if you start feeling better. Pyridium  may cause your urine to change color, which is a normal side effect of the medication. A urine culture has been sent to identify the specific bacteria causing the infection and to confirm that the prescribed antibiotic is appropriate. You will only be contacted if your results are abnormal; otherwise, you may review them in your MyChart account.   It is important to stay well hydrated by drinking plenty of fluids throughout the day. This helps flush out your urinary system and keeps your urine light yellow, which is a sign of good hydration. Avoid caffeine and alcohol, as they can irritate the bladder. Be sure to urinate regularly and empty your bladder fully. Do not hold your urine for extended periods. Always wipe from front to back after using the bathroom and use a clean tissue for each wipe. It is also important to urinate after sexual activity. Avoid douching or using sprays or powders in the genital area, as these can cause irritation. Follow up with your healthcare provider if your symptoms do not improve within a few days, get worse, or return after completing your treatment.

## 2024-06-26 NOTE — ED Triage Notes (Signed)
 Pt being seen in UC for urinary frequency, burning with urination, and pressure when urinating. Pt denies fevers. Pt reports some back pain. Pt reports taking advil and azo with no relief.

## 2024-06-26 NOTE — ED Provider Notes (Signed)
 Tabitha Warren    CSN: [id number] Arrival date & time: 06/26/24  1308      History   Chief Complaint Chief Complaint  Patient presents with   Urinary Frequency    HPI Tabitha Warren is a 58 y.o. female.   Discussed the use of AI scribe software for clinical note transcription with the patient, who gave verbal consent to proceed.   Post-menopausal female presents with urinary frequency that began Wednesday, ongoing for the past 3 days. She reports associated burning with urination, urgency and pressure. She experiences slight itching and some odor.  The patient reports lower back pain that radiates from the urinary symptoms. She has been taking Advil and Azo with minimal relief. She denies vaginal discharge, nausea, vomiting, or fevers.  The following sections of the patient's history were reviewed and updated as appropriate: allergies, current medications, past family history, past medical history, past social history, past surgical history, and problem list.     Past Medical History:  Diagnosis Date   Allergy    seasonal   Hyperlipidemia    Hypertension    pt states occasionally   Kidney stones 09/2013    Patient Active Problem List   Diagnosis Date Noted   Hyperlipidemia, mixed 03/02/2019   Vitamin D  deficiency 09/11/2016    Past Surgical History:  Procedure Laterality Date   CESAREAN SECTION     COLONOSCOPY WITH PROPOFOL  N/A 11/29/2016   Procedure: COLONOSCOPY WITH PROPOFOL ;  Surgeon: Tabitha Bi, MD;  Location: [facility] ENDOSCOPY;  Service: Endoscopy;  Laterality: N/A;   COLONOSCOPY WITH PROPOFOL  N/A 01/20/2020   Procedure: COLONOSCOPY WITH PROPOFOL ;  Surgeon: Tabitha Bi, MD;  Location: [facility] ENDOSCOPY;  Service: Gastroenterology;  Laterality: N/A;    OB History   No obstetric history on file.      Home Medications    Prior to Admission medications   Medication Sig Start Date End Date Taking? Authorizing Provider  fluconazole  (DIFLUCAN ) 150 MG  tablet Take 1 tablet (150 mg total) by mouth every 3 (three) days for 2 doses. 06/26/24 06/30/24 Yes Tabitha Lukes, FNP  nitrofurantoin , macrocrystal-monohydrate, (MACROBID ) 100 MG capsule Take 1 capsule (100 mg total) by mouth 2 (two) times daily. 06/26/24  Yes Tabitha Wessman, FNP  phenazopyridine  (PYRIDIUM ) 200 MG tablet Take 1 tablet (200 mg total) by mouth 3 (three) times daily at 8am, 3pm and bedtime for 2 days. 06/26/24 06/28/24 Yes Tabitha Lukes, FNP  atorvastatin (LIPITOR) 40 MG tablet  06/01/19   [provider]  Cholecalciferol  4000 units CAPS Take 1 capsule (4,000 Units total) by mouth daily. Patient not taking: Reported on 06/26/2024 09/11/16   Tabitha Ronal NOVAK, PA-C    Family History Family History  Problem Relation Age of Onset   Hyperlipidemia Mother    Breast cancer Neg Hx     Social History Social History   Tobacco Use   Smoking status: Never   Smokeless tobacco: Never  Substance Use Topics   Alcohol use: No   Drug use: No     Allergies   Patient has no known allergies.   Review of Systems Review of Systems  Constitutional:  Negative for fever.  Gastrointestinal:  Negative for nausea and vomiting.  Genitourinary:  Positive for dysuria, frequency and urgency. Negative for menstrual problem (postmenopausal) and vaginal discharge.       Urinary pressure. Some itching and odor.   Musculoskeletal:  Positive for back pain (right lower).  All other systems reviewed and are negative.  Physical Exam Triage Vital Signs ED Triage Vitals  Encounter Vitals Group     BP 06/26/24 1557 (!) 149/95     Girls Systolic BP Percentile --      Girls Diastolic BP Percentile --      Boys Systolic BP Percentile --      Boys Diastolic BP Percentile --      Pulse Rate 06/26/24 1557 92     Resp 06/26/24 1557 18     Temp 06/26/24 1557 98 F (36.7 C)     Temp src --      SpO2 06/26/24 1557 98 %     Weight --      Height --      Head Circumference --       Peak Flow --      Pain Score 06/26/24 1552 4     Pain Loc --      Pain Education --      Exclude from Growth Chart --    No data found.  Updated Vital Signs BP (!) 149/95   Pulse 92   Temp 98 F (36.7 C)   Resp 18   LMP 05/09/2014   SpO2 98%   Visual Acuity Right Eye Distance:   Left Eye Distance:   Bilateral Distance:    Right Eye Near:   Left Eye Near:    Bilateral Near:     Physical Exam Constitutional:      General: She is not in acute distress.    Appearance: Normal appearance. She is not ill-appearing, toxic-appearing or diaphoretic.  HENT:     Head: Normocephalic.     Nose: Nose normal.     Mouth/Throat:     Mouth: Mucous membranes are moist.  Eyes:     Conjunctiva/sclera: Conjunctivae normal.  Cardiovascular:     Rate and Rhythm: Normal rate.  Pulmonary:     Effort: Pulmonary effort is normal.  Abdominal:     Palpations: Abdomen is soft.  Musculoskeletal:        General: Normal range of motion.     Cervical back: Normal range of motion and neck supple.  Skin:    General: Skin is warm and dry.  Neurological:     General: No focal deficit present.     Mental Status: She is alert and oriented to person, place, and time.  Psychiatric:        Mood and Affect: Mood normal.        Behavior: Behavior normal.      UC Treatments / Results  Labs (all labs ordered are listed, but only abnormal results are displayed) Labs Reviewed  POCT URINE DIPSTICK - Abnormal; Notable for the following components:      Result Value   Clarity, UA cloudy (*)    Ketones, POC UA small (15) (*)    Blood, UA moderate (*)    POC PROTEIN,UA =30 (*)    Nitrite, UA Positive (*)    Leukocytes, UA Large (3+) (*)    All other components within normal limits  URINE CULTURE    EKG   Radiology No results found.  Procedures Procedures (including critical care time)  Medications Ordered in UC Medications - No data to display  Initial Impression / Assessment and Plan /  UC Course  I have reviewed the triage vital signs and the nursing notes.  Pertinent labs & imaging results that were available during my care of the patient were reviewed by me and considered  in my medical decision making (see chart for details).     Patient presents with symptoms consistent with a urinary tract infection. Urinalysis reveals microscopic hematuria, positive nitrites, and leukocyte esterase, supporting the diagnosis. Nitrofurantoin was prescribed to be taken twice daily for 5 days. Pyridium was prescribed for urinary discomfort, to be taken three times daily for 2 days, with counseling that it may turn the urine orange. A urine culture was sent to identify the causative organism and assess antibiotic sensitivity. Diflucan given due to patient reporting some itching. Patient was advised that they will be contacted only if the culture results require a change in treatment; otherwise, results can be reviewed via MyChart. Patient instructed to increase fluid intake and monitor symptoms. Follow up with primary care provider if symptoms do not improve or worsen. Emergency evaluation is warranted for fever, back or flank pain, nausea, vomiting, or signs of systemic illness.  Today's evaluation has revealed no signs of a dangerous process. Discussed diagnosis with patient and/or guardian. Patient and/or guardian aware of their diagnosis, possible red flag symptoms to watch out for and need for close follow up. Patient and/or guardian understands verbal and written discharge instructions. Patient and/or guardian comfortable with plan and disposition.  Patient and/or guardian has a clear mental status at this time, good insight into illness (after discussion and teaching) and has clear judgment to make decisions regarding their care  Documentation was completed with the aid of voice recognition software. Transcription may contain typographical errors.   Final Clinical Impressions(s) / UC Diagnoses    Final diagnoses:  Urinary frequency  Acute cystitis with hematuria     Discharge Instructions      You were seen today for symptoms consistent with a urinary tract infection (UTI). You have been prescribed Macrobid to treat the infection and Pyridium to help relieve discomfort such as burning, urgency, and bladder pressure. Take the antibiotics exactly as prescribed and complete the full course, even if you start feeling better. Pyridium  may cause your urine to change color, which is a normal side effect of the medication. A urine culture has been sent to identify the specific bacteria causing the infection and to confirm that the prescribed antibiotic is appropriate. You will only be contacted if your results are abnormal; otherwise, you may review them in your MyChart account.   It is important to stay well hydrated by drinking plenty of fluids throughout the day. This helps flush out your urinary system and keeps your urine light yellow, which is a sign of good hydration. Avoid caffeine and alcohol, as they can irritate the bladder. Be sure to urinate regularly and empty your bladder fully. Do not hold your urine for extended periods. Always wipe from front to back after using the bathroom and use a clean tissue for each wipe. It is also important to urinate after sexual activity. Avoid douching or using sprays or powders in the genital area, as these can cause irritation. Follow up with your healthcare provider if your symptoms do not improve within a few days, get worse, or return after completing your treatment.      ED Prescriptions     Medication Sig Dispense Auth. Provider   nitrofurantoin, macrocrystal-monohydrate, (MACROBID) 100 MG capsule Take 1 capsule (100 mg total) by mouth 2 (two) times daily. 10 capsule Tabitha Lukes, FNP   phenazopyridine (PYRIDIUM) 200 MG tablet Take 1 tablet (200 mg total) by mouth 3 (three) times daily at 8am, 3pm and bedtime for 2  days. 6 tablet  Sally Reimers, FNP   fluconazole (DIFLUCAN) 150 MG tablet Take 1 tablet (150 mg total) by mouth every 3 (three) days for 2 doses. 2 tablet Tabitha Lukes, FNP      PDMP not reviewed this encounter.   Tabitha Warren, [place] 06/26/24 1616

## 2024-06-28 ENCOUNTER — Ambulatory Visit (HOSPITAL_COMMUNITY): Payer: Self-pay

## 2024-06-28 LAB — URINE CULTURE: Culture: >=100000 — AB
